# Patient Record
Sex: Male | Born: 1975
Health system: Southern US, Community
[De-identification: ages and names within clinical notes are randomized; demographics above are authoritative.]

## PROBLEM LIST (undated history)

## (undated) DIAGNOSIS — T7840XA Allergy, unspecified, initial encounter: Secondary | ICD-10-CM

## (undated) DIAGNOSIS — E669 Obesity, unspecified: Secondary | ICD-10-CM

## (undated) DIAGNOSIS — B019 Varicella without complication: Secondary | ICD-10-CM

## (undated) HISTORY — DX: Allergy, unspecified, initial encounter: T78.40XA

## (undated) HISTORY — DX: Varicella without complication: B01.9

## (undated) HISTORY — DX: Obesity, unspecified: E66.9

---

## 2015-04-12 ENCOUNTER — Encounter: Payer: Self-pay | Admitting: Adult Health

## 2015-12-04 DIAGNOSIS — Z23 Encounter for immunization: Secondary | ICD-10-CM | POA: Diagnosis not present

## 2016-01-30 DIAGNOSIS — H66002 Acute suppurative otitis media without spontaneous rupture of ear drum, left ear: Secondary | ICD-10-CM | POA: Diagnosis not present

## 2016-01-30 DIAGNOSIS — H6501 Acute serous otitis media, right ear: Secondary | ICD-10-CM | POA: Diagnosis not present

## 2016-01-30 DIAGNOSIS — B9789 Other viral agents as the cause of diseases classified elsewhere: Secondary | ICD-10-CM | POA: Diagnosis not present

## 2016-01-30 DIAGNOSIS — J069 Acute upper respiratory infection, unspecified: Secondary | ICD-10-CM | POA: Diagnosis not present

## 2016-02-04 ENCOUNTER — Ambulatory Visit: Payer: Self-pay | Admitting: Adult Health

## 2016-03-12 ENCOUNTER — Encounter: Payer: Self-pay | Admitting: Adult Health

## 2016-03-12 ENCOUNTER — Ambulatory Visit (INDEPENDENT_AMBULATORY_CARE_PROVIDER_SITE_OTHER): Payer: BLUE CROSS/BLUE SHIELD | Admitting: Adult Health

## 2016-03-12 VITALS — BP 138/72 | Temp 98.6°F | Ht 68.0 in | Wt 269.2 lb

## 2016-03-12 DIAGNOSIS — Z0000 Encounter for general adult medical examination without abnormal findings: Secondary | ICD-10-CM | POA: Diagnosis not present

## 2016-03-12 LAB — CBC WITH DIFFERENTIAL/PLATELET
BASOS PCT: 0.4 % (ref 0.0–3.0)
Basophils Absolute: 0 10*3/uL (ref 0.0–0.1)
Eosinophils Absolute: 0.1 10*3/uL (ref 0.0–0.7)
Eosinophils Relative: 2 % (ref 0.0–5.0)
HEMATOCRIT: 43 % (ref 39.0–52.0)
Hemoglobin: 14.4 g/dL (ref 13.0–17.0)
LYMPHS ABS: 1.7 10*3/uL (ref 0.7–4.0)
LYMPHS PCT: 25 % (ref 12.0–46.0)
MCHC: 33.5 g/dL (ref 30.0–36.0)
MCV: 92.1 fl (ref 78.0–100.0)
MONOS PCT: 7.1 % (ref 3.0–12.0)
Monocytes Absolute: 0.5 10*3/uL (ref 0.1–1.0)
NEUTROS ABS: 4.5 10*3/uL (ref 1.4–7.7)
NEUTROS PCT: 65.5 % (ref 43.0–77.0)
PLATELETS: 225 10*3/uL (ref 150.0–400.0)
RBC: 4.67 Mil/uL (ref 4.22–5.81)
RDW: 14.1 % (ref 11.5–15.5)
WBC: 6.9 10*3/uL (ref 4.0–10.5)

## 2016-03-12 LAB — HEPATIC FUNCTION PANEL
ALBUMIN: 4.3 g/dL (ref 3.5–5.2)
ALT: 35 U/L (ref 0–53)
AST: 23 U/L (ref 0–37)
Alkaline Phosphatase: 72 U/L (ref 39–117)
Bilirubin, Direct: 0.1 mg/dL (ref 0.0–0.3)
TOTAL PROTEIN: 6.9 g/dL (ref 6.0–8.3)
Total Bilirubin: 0.4 mg/dL (ref 0.2–1.2)

## 2016-03-12 LAB — POC URINALSYSI DIPSTICK (AUTOMATED)
Bilirubin, UA: NEGATIVE
Glucose, UA: NEGATIVE
KETONES UA: NEGATIVE
LEUKOCYTES UA: NEGATIVE
NITRITE UA: NEGATIVE
PROTEIN UA: NEGATIVE
RBC UA: NEGATIVE
Urobilinogen, UA: 0.2
pH, UA: 6

## 2016-03-12 LAB — LIPID PANEL
CHOLESTEROL: 199 mg/dL (ref 0–200)
HDL: 47.5 mg/dL (ref >39.00)
LDL Cholesterol: 136 mg/dL — ABNORMAL HIGH (ref 0–99)
NonHDL: 151.83
TRIGLYCERIDES: 77 mg/dL (ref 0.0–149.0)
Total CHOL/HDL Ratio: 4
VLDL: 15.4 mg/dL (ref 0.0–40.0)

## 2016-03-12 LAB — BASIC METABOLIC PANEL
BUN: 13 mg/dL (ref 6–23)
CALCIUM: 9.5 mg/dL (ref 8.4–10.5)
CHLORIDE: 105 meq/L (ref 96–112)
CO2: 29 meq/L (ref 19–32)
CREATININE: 0.85 mg/dL (ref 0.40–1.50)
GFR: 105.74 mL/min (ref >60.00)
GLUCOSE: 93 mg/dL (ref 70–99)
Potassium: 4.3 mEq/L (ref 3.5–5.1)
SODIUM: 140 meq/L (ref 135–145)

## 2016-03-12 LAB — TSH: TSH: 1.9 u[IU]/mL (ref 0.35–4.50)

## 2016-03-12 LAB — PSA: PSA: 1.52 ng/mL (ref 0.10–4.00)

## 2016-03-12 NOTE — Progress Notes (Signed)
Patient presents to clinic today to establish care. He is a pleasant 41 year old male who  has a past medical history of Chicken pox.   Acute Concerns: Complete Physical   Chronic Issues: Obesity  - He is the heaviest he has weighed. He understands that he needs to start exercising and working on diet  Health Maintenance: Dental -- Does not do routine care Vision -- Does not do routine care Immunizations -- UTD  Colonoscopy -- Never had  Diet: He eats a lot of veggies and fruits. He likes smoothies. Does eat fast food.  Exercise: Does not do routine exercise. He likes to ride his bike   Past Medical History:  Diagnosis Date  . Chicken pox     No past surgical history on file.  No current outpatient prescriptions on file prior to visit.   No current facility-administered medications on file prior to visit.     Not on File  Family History  Problem Relation Age of Onset  . Stroke Father   . Diabetes Father   . Heart attack Maternal Grandfather     Social History   Social History  . Marital status: Married    Spouse name: N/A  . Number of children: N/A  . Years of education: N/A   Occupational History  . Not on file.   Social History Main Topics  . Smoking status: Never Smoker  . Smokeless tobacco: Never Used  . Alcohol use 1.2 oz/week    1 Glasses of wine, 1 Cans of beer per week     Comment: 'social"  . Drug use: No  . Sexual activity: Not on file   Other Topics Concern  . Not on file   Social History Narrative  . No narrative on file    Review of Systems  Constitutional: Negative.   HENT: Negative.   Eyes: Negative.   Respiratory: Negative.   Cardiovascular: Negative.   Gastrointestinal: Negative.   Genitourinary: Negative.   Musculoskeletal: Negative.   Skin: Negative.   Neurological: Negative.   Endo/Heme/Allergies: Negative.   Psychiatric/Behavioral: Negative.   All other systems reviewed and are negative.   BP 138/72   Temp  98.6 F (37 C) (Oral)   Ht 5\' 8"  (1.727 m)   Wt 269 lb 3.2 oz (122.1 kg)   BMI 40.93 kg/m   Physical Exam  Constitutional: He is oriented to person, place, and time and well-developed, well-nourished, and in no distress. No distress.  obese  HENT:  Head: Normocephalic and atraumatic.  Right Ear: External ear normal.  Left Ear: External ear normal.  Nose: Nose normal.  Mouth/Throat: Oropharynx is clear and moist. No oropharyngeal exudate.  Eyes: Conjunctivae and EOM are normal. Pupils are equal, round, and reactive to light. Right eye exhibits no discharge. Left eye exhibits no discharge. No scleral icterus.  Neck: Normal range of motion. Neck supple. No JVD present. No tracheal deviation present. No thyromegaly present.  Cardiovascular: Normal rate, regular rhythm, normal heart sounds and intact distal pulses.  Exam reveals no gallop and no friction rub.   No murmur heard. Pulmonary/Chest: Effort normal and breath sounds normal. No stridor. No respiratory distress. He has no wheezes. He has no rales. He exhibits no tenderness.  Abdominal: Soft. Bowel sounds are normal. He exhibits no distension and no mass. There is no tenderness. There is no rebound and no guarding.  Musculoskeletal: Normal range of motion. He exhibits no edema, tenderness or deformity.  Lymphadenopathy:  He has no cervical adenopathy.  Neurological: He is alert and oriented to person, place, and time. He displays normal reflexes. No cranial nerve deficit. He exhibits normal muscle tone. Gait normal. Coordination normal. GCS score is 15.  Skin: Skin is warm and dry. No rash noted. He is not diaphoretic. No erythema. No pallor.  Psychiatric: Mood, memory, affect and judgment normal.  Nursing note and vitals reviewed.   Assessment/Plan: 1. Routine general medical examination at a health care facility - Basic metabolic panel - CBC with Differential/Platelet - Hepatic function panel - Lipid panel - POCT Urinalysis  Dipstick (Automated) - TSH - PSA - needs to start exercising and cut out fast food - Follow up in one month or sooner if needed  2. Severe obesity (BMI >= 40) (HCC) - Basic metabolic panel - CBC with Differential/Platelet - Hepatic function panel - Lipid panel - POCT Urinalysis Dipstick (Automated) - TSH - PSA - Educated on the importance of frequent exercise and heart healthy diet.  - Consider referral to weight loss center  Shirline Freesory Wadsworth Skolnick, NP

## 2016-04-21 ENCOUNTER — Telehealth: Payer: Self-pay | Admitting: Adult Health

## 2016-04-21 NOTE — Telephone Encounter (Signed)
° ° °  Jorge Walters pt returned your call saying he recevied his flu shot back in Oct 2017 at [facility]

## 2016-04-22 NOTE — Telephone Encounter (Signed)
Chart updated

## 2016-12-21 DIAGNOSIS — Z23 Encounter for immunization: Secondary | ICD-10-CM | POA: Diagnosis not present

## 2017-03-02 ENCOUNTER — Ambulatory Visit (INDEPENDENT_AMBULATORY_CARE_PROVIDER_SITE_OTHER): Payer: BLUE CROSS/BLUE SHIELD | Admitting: Adult Health

## 2017-03-02 ENCOUNTER — Encounter: Payer: Self-pay | Admitting: Adult Health

## 2017-03-02 VITALS — BP 126/88 | Temp 97.9°F | Ht 69.25 in | Wt 275.0 lb

## 2017-03-02 DIAGNOSIS — E668 Other obesity: Secondary | ICD-10-CM | POA: Diagnosis not present

## 2017-03-02 DIAGNOSIS — Z125 Encounter for screening for malignant neoplasm of prostate: Secondary | ICD-10-CM

## 2017-03-02 DIAGNOSIS — Z23 Encounter for immunization: Secondary | ICD-10-CM

## 2017-03-02 DIAGNOSIS — Z0000 Encounter for general adult medical examination without abnormal findings: Secondary | ICD-10-CM | POA: Diagnosis not present

## 2017-03-02 LAB — CBC WITH DIFFERENTIAL/PLATELET
BASOS PCT: 0.7 % (ref 0.0–3.0)
Basophils Absolute: 0 10*3/uL (ref 0.0–0.1)
EOS PCT: 1.7 % (ref 0.0–5.0)
Eosinophils Absolute: 0.1 10*3/uL (ref 0.0–0.7)
HCT: 44.3 % (ref 39.0–52.0)
Hemoglobin: 14.6 g/dL (ref 13.0–17.0)
Lymphocytes Relative: 23.7 % (ref 12.0–46.0)
Lymphs Abs: 1.7 10*3/uL (ref 0.7–4.0)
MCHC: 32.9 g/dL (ref 30.0–36.0)
MCV: 95.4 fl (ref 78.0–100.0)
MONO ABS: 0.7 10*3/uL (ref 0.1–1.0)
Monocytes Relative: 9 % (ref 3.0–12.0)
Neutro Abs: 4.7 10*3/uL (ref 1.4–7.7)
Neutrophils Relative %: 64.9 % (ref 43.0–77.0)
PLATELETS: 224 10*3/uL (ref 150.0–400.0)
RBC: 4.64 Mil/uL (ref 4.22–5.81)
RDW: 13.7 % (ref 11.5–15.5)
WBC: 7.3 10*3/uL (ref 4.0–10.5)

## 2017-03-02 LAB — HEPATIC FUNCTION PANEL
ALBUMIN: 4.4 g/dL (ref 3.5–5.2)
ALT: 25 U/L (ref 0–53)
AST: 20 U/L (ref 0–37)
Alkaline Phosphatase: 77 U/L (ref 39–117)
Bilirubin, Direct: 0.1 mg/dL (ref 0.0–0.3)
TOTAL PROTEIN: 6.8 g/dL (ref 6.0–8.3)
Total Bilirubin: 0.4 mg/dL (ref 0.2–1.2)

## 2017-03-02 LAB — LIPID PANEL
CHOLESTEROL: 198 mg/dL (ref 0–200)
HDL: 44.7 mg/dL (ref >39.00)
LDL Cholesterol: 133 mg/dL — ABNORMAL HIGH (ref 0–99)
NonHDL: 153.15
Total CHOL/HDL Ratio: 4
Triglycerides: 102 mg/dL (ref 0.0–149.0)
VLDL: 20.4 mg/dL (ref 0.0–40.0)

## 2017-03-02 LAB — BASIC METABOLIC PANEL
BUN: 12 mg/dL (ref 6–23)
CO2: 29 mEq/L (ref 19–32)
Calcium: 9.5 mg/dL (ref 8.4–10.5)
Chloride: 102 mEq/L (ref 96–112)
Creatinine, Ser: 0.94 mg/dL (ref 0.40–1.50)
GFR: 93.7 mL/min (ref >60.00)
GLUCOSE: 98 mg/dL (ref 70–99)
POTASSIUM: 4.2 meq/L (ref 3.5–5.1)
Sodium: 138 mEq/L (ref 135–145)

## 2017-03-02 LAB — TSH: TSH: 3.22 u[IU]/mL (ref 0.35–4.50)

## 2017-03-02 LAB — HEMOGLOBIN A1C: HEMOGLOBIN A1C: 5.7 % (ref 4.6–6.5)

## 2017-03-02 LAB — PSA: PSA: 1 ng/mL (ref 0.10–4.00)

## 2017-03-02 NOTE — Progress Notes (Signed)
Subjective:    Patient ID: Jorge Walters, male    DOB: 1975-12-10, 42 y.o.   MRN: [id number]  HPI  Patient presents for yearly preventative medicine examination. He is a pleasant 42 year old male who  has a past medical history of Chicken pox and Obesity.  All immunizations and health maintenance protocols were reviewed with the patient and needed orders were placed.  Appropriate screening laboratory values were ordered for the patient including screening of hyperlipidemia, renal function and hepatic function.  Medication reconciliation,  past medical history, social history, problem list and allergies were reviewed in detail with the patient  Goals were established with regard to weight loss, exercise, and  diet in compliance with medications. He does not follow a specific diet, has been eating a lot of fast food. He does ride his bike about 15 miles 3 x a week. Weight is up 6 pounds over the last year   Wt Readings from Last 3 Encounters:  03/02/17 275 lb (124.7 kg)  03/12/16 269 lb 3.2 oz (122.1 kg)   He does not participate in routine dental or vision exams. He plans on making an appointment with his dentist.   He has no acute complaints.   Review of Systems  Constitutional: Negative.   HENT: Negative.   Eyes: Negative.   Respiratory: Negative.   Cardiovascular: Negative.   Gastrointestinal: Negative.   Endocrine: Negative.   Genitourinary: Negative.   Musculoskeletal: Negative.   Skin: Negative.   Allergic/Immunologic: Negative.   Neurological: Negative.   Hematological: Negative.   Psychiatric/Behavioral: Negative.   All other systems reviewed and are negative.  Past Medical History:  Diagnosis Date  . Chicken pox   . Obesity     Social History   Socioeconomic History  . Marital status: Married    Spouse name: Not on file  . Number of children: Not on file  . Years of education: Not on file  . Highest education level: Not on file  Social Needs    . Financial resource strain: Not on file  . Food insecurity - worry: Not on file  . Food insecurity - inability: Not on file  . Transportation needs - medical: Not on file  . Transportation needs - non-medical: Not on file  Occupational History  . Not on file  Tobacco Use  . Smoking status: Never Smoker  . Smokeless tobacco: Never Used  Substance and Sexual Activity  . Alcohol use: Yes    Alcohol/week: 1.2 oz    Types: 1 Glasses of wine, 1 Cans of beer per week    Comment: 'social"  . Drug use: No  . Sexual activity: Not on file  Other Topics Concern  . 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    Married    No children        History reviewed. No pertinent surgical history.  Family History  Problem Relation Age of Onset  . Stroke Father   . Diabetes Father   . Heart attack Maternal Grandfather     No Known Allergies  Current Outpatient Medications on File Prior to Visit  Medication Sig Dispense Refill  . cetirizine (KLS ALLER-TEC) 10 MG tablet Take 10 mg by mouth daily.    Marland Kitchen GLUCOSAMINE-CHONDROITIN PO Take by mouth.    . Multiple Vitamin (MULTIVITAMIN) capsule Take 1 capsule by mouth daily.    . Omega-3 Fatty Acids (FISH OIL) 1000  MG CAPS Take by mouth.    . Probiotic Product (PROBIOTIC-10) CAPS Take by mouth.     No current facility-administered medications on file prior to visit.     BP 126/88 (BP Location: Left Arm)   Temp 97.9 F (36.6 C) (Oral)   Ht 5' 9.25" (1.759 m)   Wt 275 lb (124.7 kg)   BMI 40.32 kg/m       Objective:   Physical Exam  Constitutional: He is oriented to person, place, and time. He appears well-developed and well-nourished. No distress.  Obese    HENT:  Head: Normocephalic and atraumatic.  Right Ear: External ear normal.  Left Ear: External ear normal.  Nose: Nose normal.  Mouth/Throat: Oropharynx is clear and moist. No oropharyngeal exudate.  Eyes: Conjunctivae  and EOM are normal. Pupils are equal, round, and reactive to light. Right eye exhibits no discharge. Left eye exhibits no discharge. No scleral icterus.  Neck: Normal range of motion. Neck supple. No JVD present. No tracheal deviation present. No thyromegaly present.  Cardiovascular: Normal rate, regular rhythm, normal heart sounds and intact distal pulses. Exam reveals no gallop and no friction rub.  No murmur heard. Pulmonary/Chest: Effort normal and breath sounds normal. No stridor. No respiratory distress. He has no wheezes. He has no rales. He exhibits no tenderness.  Abdominal: Soft. Bowel sounds are normal. He exhibits no distension and no mass. There is no tenderness. There is no rebound and no guarding.  Musculoskeletal: Normal range of motion. He exhibits no edema, tenderness or deformity.  Lymphadenopathy:    He has no cervical adenopathy.  Neurological: He is alert and oriented to person, place, and time. He has normal reflexes. He displays normal reflexes. No cranial nerve deficit. He exhibits normal muscle tone. Coordination normal.  Skin: Skin is warm and dry. No rash noted. He is not diaphoretic. No erythema. No pallor.  Psychiatric: He has a normal mood and affect. His behavior is normal. Judgment and thought content normal.  Nursing note and vitals reviewed.     Assessment & Plan:  1. Routine general medical examination at a health care facility - Needs to lose weight  - Follow up in one year or sooner if needed - Basic metabolic panel - CBC with Differential/Platelet - Hemoglobin A1c - Hepatic function panel - Lipid panel - TSH - PSA  2. Other obesity - Needs to cut out fast food.  - Would like to work on diet for 6 months and then if needed be referred to weight loss clinic  - Basic metabolic panel - CBC with Differential/Platelet - Hemoglobin A1c - Hepatic function panel - Lipid panel - TSH - PSA   3. Need for Tdap vaccination - Tdap vaccine greater than  or equal to 7yo IM   Shirline Freesory Traye Bates, NP

## 2017-05-22 DIAGNOSIS — J0140 Acute pansinusitis, unspecified: Secondary | ICD-10-CM | POA: Diagnosis not present

## 2017-12-17 ENCOUNTER — Other Ambulatory Visit: Payer: Self-pay

## 2017-12-17 ENCOUNTER — Emergency Department (HOSPITAL_COMMUNITY)
Admission: EM | Admit: 2017-12-17 | Discharge: 2017-12-17 | Disposition: A | Discharge status: Home / Self Care | Payer: BLUE CROSS/BLUE SHIELD | Attending: Emergency Medicine | Admitting: Emergency Medicine

## 2017-12-17 ENCOUNTER — Encounter (HOSPITAL_COMMUNITY): Payer: Self-pay | Admitting: Emergency Medicine

## 2017-12-17 ENCOUNTER — Emergency Department (HOSPITAL_COMMUNITY): Payer: BLUE CROSS/BLUE SHIELD

## 2017-12-17 DIAGNOSIS — R0789 Other chest pain: Secondary | ICD-10-CM

## 2017-12-17 DIAGNOSIS — M25512 Pain in left shoulder: Secondary | ICD-10-CM | POA: Insufficient documentation

## 2017-12-17 DIAGNOSIS — Z79899 Other long term (current) drug therapy: Secondary | ICD-10-CM | POA: Insufficient documentation

## 2017-12-17 DIAGNOSIS — R079 Chest pain, unspecified: Secondary | ICD-10-CM | POA: Diagnosis not present

## 2017-12-17 DIAGNOSIS — E785 Hyperlipidemia, unspecified: Secondary | ICD-10-CM | POA: Insufficient documentation

## 2017-12-17 LAB — CBC
HCT: 42.3 % (ref 39.0–52.0)
Hemoglobin: 13.7 g/dL (ref 13.0–17.0)
MCH: 31.3 pg (ref 26.0–34.0)
MCHC: 32.4 g/dL (ref 30.0–36.0)
MCV: 96.6 fL (ref 80.0–100.0)
NRBC: 0 % (ref 0.0–0.2)
PLATELETS: 193 10*3/uL (ref 150–400)
RBC: 4.38 MIL/uL (ref 4.22–5.81)
RDW: 13.2 % (ref 11.5–15.5)
WBC: 6.8 10*3/uL (ref 4.0–10.5)

## 2017-12-17 LAB — BASIC METABOLIC PANEL
Anion gap: 9 (ref 5–15)
BUN: 11 mg/dL (ref 6–20)
CALCIUM: 9.3 mg/dL (ref 8.9–10.3)
CO2: 24 mmol/L (ref 22–32)
CREATININE: 0.85 mg/dL (ref 0.61–1.24)
Chloride: 104 mmol/L (ref 98–111)
Glucose, Bld: 102 mg/dL — ABNORMAL HIGH (ref 70–99)
Potassium: 3.9 mmol/L (ref 3.5–5.1)
SODIUM: 137 mmol/L (ref 135–145)

## 2017-12-17 LAB — I-STAT TROPONIN, ED
TROPONIN I, POC: 0 ng/mL (ref 0.00–0.08)
TROPONIN I, POC: 0 ng/mL (ref 0.00–0.08)

## 2017-12-17 LAB — D-DIMER, QUANTITATIVE (NOT AT ARMC)

## 2017-12-17 MED ORDER — KETOROLAC TROMETHAMINE 15 MG/ML IJ SOLN
15.0000 mg | Freq: Once | INTRAMUSCULAR | Status: AC
  Administered 2017-12-17: 15 mg via INTRAVENOUS
  Filled 2017-12-17: qty 1

## 2017-12-17 NOTE — ED Triage Notes (Signed)
Pt from home with c/o left sides CP that began a few days ago but got worse this morning. Pt states pain is now more centrally located. Pt denies sob and denies any recent changes in diet or heavy lifting.

## 2017-12-17 NOTE — ED Provider Notes (Signed)
[facility]-EMERGENCY DEPT Provider Note   CSN: [id number] Arrival date & time: 12/17/17  4098     History   Chief Complaint Chief Complaint  Patient presents with  . Chest Pain    HPI Jorge Walters is a 42 y.o. male.  HPI  42 year old male comes in with chief complaint of chest pain.  Patient is obese and has borderline hyperlipidemia.  He reports that he has had 2 or 3 episodes of subscapular left-sided pain over the past few days.  This morning he woke up with severe subscapular pain that was radiating towards his left chest.  Pain was different than his prior episodes which did not radiate all the way to the left chest.  Patient denies any associated shortness of breath, nausea, diaphoresis.  The pain is worse in certain positions and when he takes a deep breath.  He is unsure of any exertional component to the pain.  Patient denies any smoking.  He does indicate that he took a 7000 mile road trip in September.  There is no history of DVT, PE and patient denies any substance abuse.  Past Medical History:  Diagnosis Date  . Chicken pox   . Obesity     Patient Active Problem List   Diagnosis Date Noted  . Other obesity 03/02/2017    History reviewed. No pertinent surgical history.      Home Medications    Prior to Admission medications   Medication Sig Start Date End Date Taking? Authorizing Provider  cetirizine (KLS ALLER-TEC) 10 MG tablet Take 10 mg by mouth daily.   Yes [provider]  GLUCOSAMINE-CHONDROITIN PO Take by mouth.   Yes [provider]  Multiple Vitamin (MULTIVITAMIN) capsule Take 1 capsule by mouth daily.   Yes [provider]  Omega-3 Fatty Acids (FISH OIL) 1000 MG CAPS Take by mouth.   Yes [provider]  Probiotic Product (PROBIOTIC-10) CAPS Take by mouth.   Yes [provider]    Family History Family History  Problem Relation Age of Onset  . Stroke Father   .  Diabetes Father   . Heart attack Maternal Grandfather     Social History Social History   Tobacco Use  . Smoking status: Never Smoker  . Smokeless tobacco: Never Used  Substance Use Topics  . Alcohol use: Yes    Alcohol/week: 2.0 standard drinks    Types: 1 Glasses of wine, 1 Cans of beer per week    Comment: 'social"  . Drug use: No     Allergies   Patient has no known allergies.   Review of Systems Review of Systems  Constitutional: Positive for activity change.  Respiratory: Negative for shortness of breath.   Cardiovascular: Positive for chest pain.  Gastrointestinal: Negative for nausea.  All other systems reviewed and are negative.    Physical Exam Updated Vital Signs BP (!) 141/90   Pulse 65   Temp 97.9 F (36.6 C) (Oral)   Resp (!) 25   SpO2 96%   Physical Exam  Constitutional: He is oriented to person, place, and time. He appears well-developed.  HENT:  Head: Atraumatic.  Neck: Neck supple.  Cardiovascular: Normal rate, intact distal pulses and normal pulses.  Pulmonary/Chest: Effort normal.  Neurological: He is alert and oriented to person, place, and time.  Skin: Skin is warm.  Nursing note and vitals reviewed.    ED Treatments / Results  Labs (all labs ordered are listed, but only  abnormal results are displayed) Labs Reviewed  BASIC METABOLIC PANEL - Abnormal; Notable for the following components:      Result Value   Glucose, Bld 102 (*)    All other components within normal limits  CBC  D-DIMER, QUANTITATIVE (NOT AT [facility])  I-STAT TROPONIN, ED  I-STAT TROPONIN, ED    EKG EKG Interpretation  Date/Time:  Friday December 17 2017 07:07:23 EDT Ventricular Rate:  74 PR Interval:    QRS Duration: 93 QT Interval:  371 QTC Calculation: 412 R Axis:   60 Text Interpretation:  Sinus rhythm No acute changes No old tracing to compare Confirmed by Derwood Kaplan [phone]) on 12/17/2017 7:17:03 AM   Radiology Dg Chest 2 View  Result Date:  12/17/2017 CLINICAL DATA:  Chest pain EXAM: CHEST - 2 VIEW COMPARISON:  None. FINDINGS: Lungs are clear. Heart size and pulmonary vascularity are normal. No pneumothorax. No adenopathy. No bone lesions. IMPRESSION: No edema or consolidation. Electronically Signed   By: Bretta Bang III M.D.   On: 12/17/2017 08:22    Procedures Procedures (including critical care time)  Medications Ordered in ED Medications  ketorolac (TORADOL) 15 MG/ML injection 15 mg (15 mg Intravenous Given 12/17/17 0956)     Initial Impression / Assessment and Plan / ED Course  I have reviewed the triage vital signs and the nursing notes.  Pertinent labs & imaging results that were available during my care of the patient were reviewed by me and considered in my medical decision making (see chart for details).  Clinical Course as of Dec 17 1216  Caleen Essex Dec 17, 2017  6045 Results from the ER workup discussed with the patient face to face and all questions answered to the best of my ability.    [AN]    Clinical Course User Index [AN] Derwood Kaplan, MD    42 year old male comes in with chief complaint of chest pain.  Patient's pain is actually starting in the subscapular region on the left side and radiating up towards the chest.  Pain is also worse with deep inspiration and there are no associated concerning constitutional's.  HEAR score is 2. EKG has no acute findings.  Patient's Wells score is indicating that he has low probability for PE, therefore we will proceed with a d-dimer.  Final Clinical Impressions(s) / ED Diagnoses   Final diagnoses:  Atypical chest pain  Subscapular pain, left    ED Discharge Orders    None       Derwood Kaplan, MD 12/17/17 1218

## 2017-12-17 NOTE — Discharge Instructions (Addendum)

## 2017-12-21 ENCOUNTER — Telehealth: Payer: Self-pay | Admitting: *Deleted

## 2017-12-21 NOTE — Telephone Encounter (Signed)
FYI

## 2017-12-21 NOTE — Telephone Encounter (Signed)
Copied from CRM [id number]. Topic: General - Inquiry >> Dec 21, 2017  1:13 PM Crist Infante wrote: Reason for CRM: pt wants Kandee Keen to know he went to the [facility] on 11/01 for chest pain.  Pt states this pain was different than before.  But everything checked out. He states unless you want to see him, he will see you in Jan for his yearly. Pt did not want you to be surprised when he sees you next.

## 2018-08-07 DIAGNOSIS — Z1159 Encounter for screening for other viral diseases: Secondary | ICD-10-CM | POA: Diagnosis not present

## 2020-03-14 ENCOUNTER — Other Ambulatory Visit: Payer: Self-pay

## 2020-03-15 ENCOUNTER — Ambulatory Visit (INDEPENDENT_AMBULATORY_CARE_PROVIDER_SITE_OTHER): Payer: BC Managed Care – PPO | Admitting: Adult Health

## 2020-03-15 ENCOUNTER — Encounter: Payer: Self-pay | Admitting: Adult Health

## 2020-03-15 VITALS — BP 118/78 | Temp 97.7°F | Ht 69.25 in | Wt 281.0 lb

## 2020-03-15 DIAGNOSIS — Z0000 Encounter for general adult medical examination without abnormal findings: Secondary | ICD-10-CM | POA: Diagnosis not present

## 2020-03-15 DIAGNOSIS — E668 Other obesity: Secondary | ICD-10-CM | POA: Diagnosis not present

## 2020-03-15 LAB — CBC WITH DIFFERENTIAL/PLATELET
Basophils Absolute: 0 10*3/uL (ref 0.0–0.1)
Basophils Relative: 0.8 % (ref 0.0–3.0)
Eosinophils Absolute: 0.1 10*3/uL (ref 0.0–0.7)
Eosinophils Relative: 1.8 % (ref 0.0–5.0)
HCT: 42.3 % (ref 39.0–52.0)
Hemoglobin: 14.2 g/dL (ref 13.0–17.0)
Lymphocytes Relative: 27.7 % (ref 12.0–46.0)
Lymphs Abs: 1.5 10*3/uL (ref 0.7–4.0)
MCHC: 33.6 g/dL (ref 30.0–36.0)
MCV: 93 fl (ref 78.0–100.0)
Monocytes Absolute: 0.5 10*3/uL (ref 0.1–1.0)
Monocytes Relative: 9.9 % (ref 3.0–12.0)
Neutro Abs: 3.3 10*3/uL (ref 1.4–7.7)
Neutrophils Relative %: 59.8 % (ref 43.0–77.0)
Platelets: 210 10*3/uL (ref 150.0–400.0)
RBC: 4.54 Mil/uL (ref 4.22–5.81)
RDW: 13.7 % (ref 11.5–15.5)
WBC: 5.5 10*3/uL (ref 4.0–10.5)

## 2020-03-15 LAB — COMPREHENSIVE METABOLIC PANEL
ALT: 32 U/L (ref 0–53)
AST: 34 U/L (ref 0–37)
Albumin: 4.2 g/dL (ref 3.5–5.2)
Alkaline Phosphatase: 62 U/L (ref 39–117)
BUN: 10 mg/dL (ref 6–23)
CO2: 29 mEq/L (ref 19–32)
Calcium: 9.5 mg/dL (ref 8.4–10.5)
Chloride: 103 mEq/L (ref 96–112)
Creatinine, Ser: 0.96 mg/dL (ref 0.40–1.50)
GFR: 96.01 mL/min (ref >60.00)
Glucose, Bld: 94 mg/dL (ref 70–99)
Potassium: 4.4 mEq/L (ref 3.5–5.1)
Sodium: 138 mEq/L (ref 135–145)
Total Bilirubin: 0.5 mg/dL (ref 0.2–1.2)
Total Protein: 6.6 g/dL (ref 6.0–8.3)

## 2020-03-15 LAB — LIPID PANEL
Cholesterol: 180 mg/dL (ref 0–200)
HDL: 45.2 mg/dL (ref >39.00)
LDL Cholesterol: 110 mg/dL — ABNORMAL HIGH (ref 0–99)
NonHDL: 135.2
Total CHOL/HDL Ratio: 4
Triglycerides: 124 mg/dL (ref 0.0–149.0)
VLDL: 24.8 mg/dL (ref 0.0–40.0)

## 2020-03-15 LAB — HEMOGLOBIN A1C: Hgb A1c MFr Bld: 5.5 % (ref 4.6–6.5)

## 2020-03-15 LAB — TSH: TSH: 3.29 u[IU]/mL (ref 0.35–4.50)

## 2020-03-15 NOTE — Patient Instructions (Addendum)
It was great seeing you today   We will follow up with you regarding your blood work   Please continue to exercise   I will see you back in one year

## 2020-03-15 NOTE — Progress Notes (Signed)
Subjective:    Patient ID: Jorge Walters, male    DOB: [birth year], 45 y.o.   MRN: [id number]  HPI Patient presents for yearly preventative medicine examination. He is a pleasant 45 year old male who  has a past medical history of Chicken pox and Obesity.   He was last seen in the office in 2019.   Obesity - Since December 2019 he has been riding his bike for 30 minutes 4-5 times a week and has been eating healthier with fruits and vegetables.   All immunizations and health maintenance protocols were reviewed with the patient and needed orders were placed.  Appropriate screening laboratory values were ordered for the patient including screening of hyperlipidemia, renal function and hepatic function.  Medication reconciliation,  past medical history, social history, problem list and allergies were reviewed in detail with the patient  Goals were established with regard to weight loss, exercise, and  diet in compliance with medications.  Wt Readings from Last 3 Encounters:  03/15/20 281 lb (127.5 kg)  03/02/17 275 lb (124.7 kg)  03/12/16 269 lb 3.2 oz (122.1 kg)   Review of Systems  Constitutional: Negative.   HENT: Negative.   Eyes: Negative.   Respiratory: Negative.   Cardiovascular: Negative.   Gastrointestinal: Negative.   Endocrine: Negative.   Genitourinary: Negative.   Musculoskeletal: Negative.   Skin: Negative.   Allergic/Immunologic: Negative.   Neurological: Negative.   Hematological: Negative.   Psychiatric/Behavioral: Negative.   All other systems reviewed and are negative.  Past Medical History:  Diagnosis Date  . Chicken pox   . Obesity     Social History   Socioeconomic History  . Marital status: Married    Spouse name: Not on file  . Number of children: Not on file  . Years of education: Not on file  . Highest education level: Not on file  Occupational History  . Not on file  Tobacco Use  . Smoking status: Never Smoker  . Smokeless  tobacco: Never Used  Substance and Sexual Activity  . Alcohol use: Yes    Alcohol/week: 2.0 standard drinks    Types: 1 Glasses of wine, 1 Cans of beer per week    Comment: 'social"  . Drug use: No  . Sexual activity: Not on file  Other Topics Concern  . 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    Married    No children       Social Determinants of Corporate investment banker Strain: Not on file  Food Insecurity: Not on file  Transportation Needs: Not on file  Physical Activity: Not on file  Stress: Not on file  Social Connections: Not on file  Intimate Partner Violence: Not on file    History reviewed. No pertinent surgical history.  Family History  Problem Relation Age of Onset  . Stroke Father   . Diabetes Father   . Heart attack Maternal Grandfather     No Known Allergies  Current Outpatient Medications on File Prior to Visit  Medication Sig Dispense Refill  . cetirizine (ZYRTEC) 10 MG tablet Take 10 mg by mouth daily.    Marland Kitchen GLUCOSAMINE-CHONDROITIN PO Take by mouth.    . Multiple Vitamin (MULTIVITAMIN) capsule Take 1 capsule by mouth daily.    . Omega-3 Fatty Acids (FISH OIL) 1000 MG CAPS Take by mouth.    . Probiotic Product (PROBIOTIC-10) CAPS Take by mouth.  No current facility-administered medications on file prior to visit.    BP (!) 130/92   Temp 97.7 F (36.5 C)   Ht 5' 9.25" (1.759 m) Comment: WITH SHOES  Wt 281 lb (127.5 kg)   BMI 41.20 kg/m       Objective:   Physical Exam Vitals and nursing note reviewed.  Constitutional:      General: He is not in acute distress.    Appearance: Normal appearance. He is well-developed. He is obese.  HENT:     Head: Normocephalic and atraumatic.     Right Ear: Tympanic membrane, ear canal and external ear normal. There is no impacted cerumen.     Left Ear: Tympanic membrane, ear canal and external ear normal. There is no impacted cerumen.      Nose: Nose normal. No congestion or rhinorrhea.     Mouth/Throat:     Mouth: Mucous membranes are moist.     Pharynx: Oropharynx is clear. No oropharyngeal exudate or posterior oropharyngeal erythema.  Eyes:     General:        Right eye: No discharge.        Left eye: No discharge.     Extraocular Movements: Extraocular movements intact.     Conjunctiva/sclera: Conjunctivae normal.     Pupils: Pupils are equal, round, and reactive to light.  Neck:     Vascular: No carotid bruit.     Trachea: No tracheal deviation.  Cardiovascular:     Rate and Rhythm: Normal rate and regular rhythm.     Pulses: Normal pulses.     Heart sounds: Normal heart sounds. No murmur heard. No friction rub. No gallop.   Pulmonary:     Effort: Pulmonary effort is normal. No respiratory distress.     Breath sounds: Normal breath sounds. No stridor. No wheezing, rhonchi or rales.  Chest:     Chest wall: No tenderness.  Abdominal:     General: Bowel sounds are normal. There is no distension.     Palpations: Abdomen is soft. There is no mass.     Tenderness: There is no abdominal tenderness. There is no right CVA tenderness, left CVA tenderness, guarding or rebound.     Hernia: No hernia is present.  Musculoskeletal:        General: No swelling, tenderness, deformity or signs of injury. Normal range of motion.     Right lower leg: No edema.     Left lower leg: No edema.  Lymphadenopathy:     Cervical: No cervical adenopathy.  Skin:    General: Skin is warm and dry.     Capillary Refill: Capillary refill takes less than 2 seconds.     Coloration: Skin is not jaundiced or pale.     Findings: No bruising, erythema, lesion or rash.  Neurological:     General: No focal deficit present.     Mental Status: He is alert and oriented to person, place, and time.     Cranial Nerves: No cranial nerve deficit.     Sensory: No sensory deficit.     Motor: No weakness.     Coordination: Coordination normal.      Gait: Gait normal.     Deep Tendon Reflexes: Reflexes normal.  Psychiatric:        Mood and Affect: Mood normal.        Behavior: Behavior normal.        Thought Content: Thought content normal.  Judgment: Judgment normal.       Assessment & Plan:  1. Routine general medical examination at a health care facility - Follow up in one year or sooner if needed - CBC with Differential/Platelet; Future - Comprehensive metabolic panel; Future - Hemoglobin A1c; Future - Lipid panel; Future - TSH; Future  2. Other obesity - Increase resistance and duration of time - Continue to eat healthy - CBC with Differential/Platelet; Future - Comprehensive metabolic panel; Future - Hemoglobin A1c; Future - Lipid panel; Future - TSH; Future  Shirline Frees, NP

## 2020-06-10 ENCOUNTER — Other Ambulatory Visit: Payer: Self-pay

## 2020-06-11 ENCOUNTER — Encounter: Payer: Self-pay | Admitting: Adult Health

## 2020-06-11 ENCOUNTER — Ambulatory Visit (INDEPENDENT_AMBULATORY_CARE_PROVIDER_SITE_OTHER): Payer: BC Managed Care – PPO | Admitting: Adult Health

## 2020-06-11 VITALS — BP 136/84 | HR 81 | Temp 98.0°F | Wt 276.8 lb

## 2020-06-11 DIAGNOSIS — N50819 Testicular pain, unspecified: Secondary | ICD-10-CM

## 2020-06-11 DIAGNOSIS — Z1211 Encounter for screening for malignant neoplasm of colon: Secondary | ICD-10-CM | POA: Diagnosis not present

## 2020-06-11 NOTE — Progress Notes (Signed)
Subjective:    Patient ID: Jorge Walters, male    DOB: 06-Sep-1975, 45 y.o.   MRN: [id number]  HPI 45 year old male who  has a past medical history of Chicken pox and Obesity.  He presents to the office today for an acute issue of testicular pain   Symptoms have been present since around 04/05/2020. He reports that he had some discomfort 4/10 after exercise ( bike riding and hiking), he did have some inner thigh pain with this as wel. After about 4 days of rest his symptoms resolved. Since that time his symptoms have been intermittent but over the last two weeks been more constant with 1-2/10 pain level. During this time he continues to ride his Pelaton.  Over the last few days he has decreased his Peloton use and his symptoms are slowly starting to resolve again.  He denies any testicular or scrotal swelling, testicular lumps or bumps, symptoms of UTI, fevers, or chills.  He has been having normal bowel movements with no blood or pain.  No abdominal pain.  He has not been using any medications over-the-counter  He is turning 45 and would like to schedule his colonoscopy   Review of Systems See HPI   Past Medical History:  Diagnosis Date  . Chicken pox   . Obesity     Social History   Socioeconomic History  . Marital status: Married    Spouse name: Not on file  . Number of children: Not on file  . Years of education: Not on file  . Highest education level: Not on file  Occupational History  . Not on file  Tobacco Use  . Smoking status: Never Smoker  . Smokeless tobacco: Never Used  Substance and Sexual Activity  . Alcohol use: Yes    Alcohol/week: 2.0 standard drinks    Types: 1 Glasses of wine, 1 Cans of beer per week    Comment: 'social"  . Drug use: No  . Sexual activity: Not on file  Other Topics Concern  . 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    Married    No children       Social  Determinants of Corporate investment banker Strain: Not on file  Food Insecurity: Not on file  Transportation Needs: Not on file  Physical Activity: Not on file  Stress: Not on file  Social Connections: Not on file  Intimate Partner Violence: Not on file    No past surgical history on file.  Family History  Problem Relation Age of Onset  . Stroke Father   . Diabetes Father   . Heart attack Maternal Grandfather     No Known Allergies  Current Outpatient Medications on File Prior to Visit  Medication Sig Dispense Refill  . cetirizine (ZYRTEC) 10 MG tablet Take 10 mg by mouth daily.    Marland Kitchen GLUCOSAMINE-CHONDROITIN PO Take by mouth.    . Multiple Vitamin (MULTIVITAMIN) capsule Take 1 capsule by mouth daily.    . Omega-3 Fatty Acids (FISH OIL) 1000 MG CAPS Take by mouth.    . Probiotic Product (PROBIOTIC-10) CAPS Take by mouth.     No current facility-administered medications on file prior to visit.    BP 136/84 (BP Location: Left Arm, Patient Position: Sitting, Cuff Size: Normal)   Pulse 81   Temp 98 F (36.7 C) (Oral)   Wt 276 lb 12.8 oz (125.6 kg)  SpO2 91%   BMI 40.58 kg/m       Objective:   Physical Exam Vitals and nursing note reviewed.  Constitutional:      Appearance: Normal appearance.  Abdominal:     Hernia: There is no hernia in the left inguinal area or right inguinal area.  Genitourinary:    Penis: Normal. No erythema, tenderness, discharge, swelling or lesions.      Testes: Normal. Cremasteric reflex is present.        Right: Mass, tenderness, swelling, testicular hydrocele or varicocele not present.        Left: Mass, tenderness, swelling, testicular hydrocele or varicocele not present.     Epididymis:     Right: Normal.     Left: Normal.  Musculoskeletal:        General: Normal range of motion.  Lymphadenopathy:     Lower Body: No right inguinal adenopathy. No left inguinal adenopathy.  Skin:    General: Skin is warm and dry.     Capillary  Refill: Capillary refill takes less than 2 seconds.  Neurological:     Mental Status: He is alert.       Assessment & Plan:  1. Testicular discomfort - Completely benign exam -He is going to go to [place] for a conference in the next few days, will have him not ride his bike until he returns.  If symptoms have resolved and come back with bicycle riding then he may need to have a new or get some padded shorts.  If symptoms have continued and he let me know and we will order an ultrasound  2. Colon cancer screening  - Ambulatory referral to Gastroenterology  Shirline Frees, NP

## 2020-06-16 IMAGING — CR DG CHEST 2V
2 series · 2 of 2 positions shown · non-contrast
Comparison: None.

CLINICAL DATA: Chest pain

EXAM:
CHEST - 2 VIEW

[w chest pa]
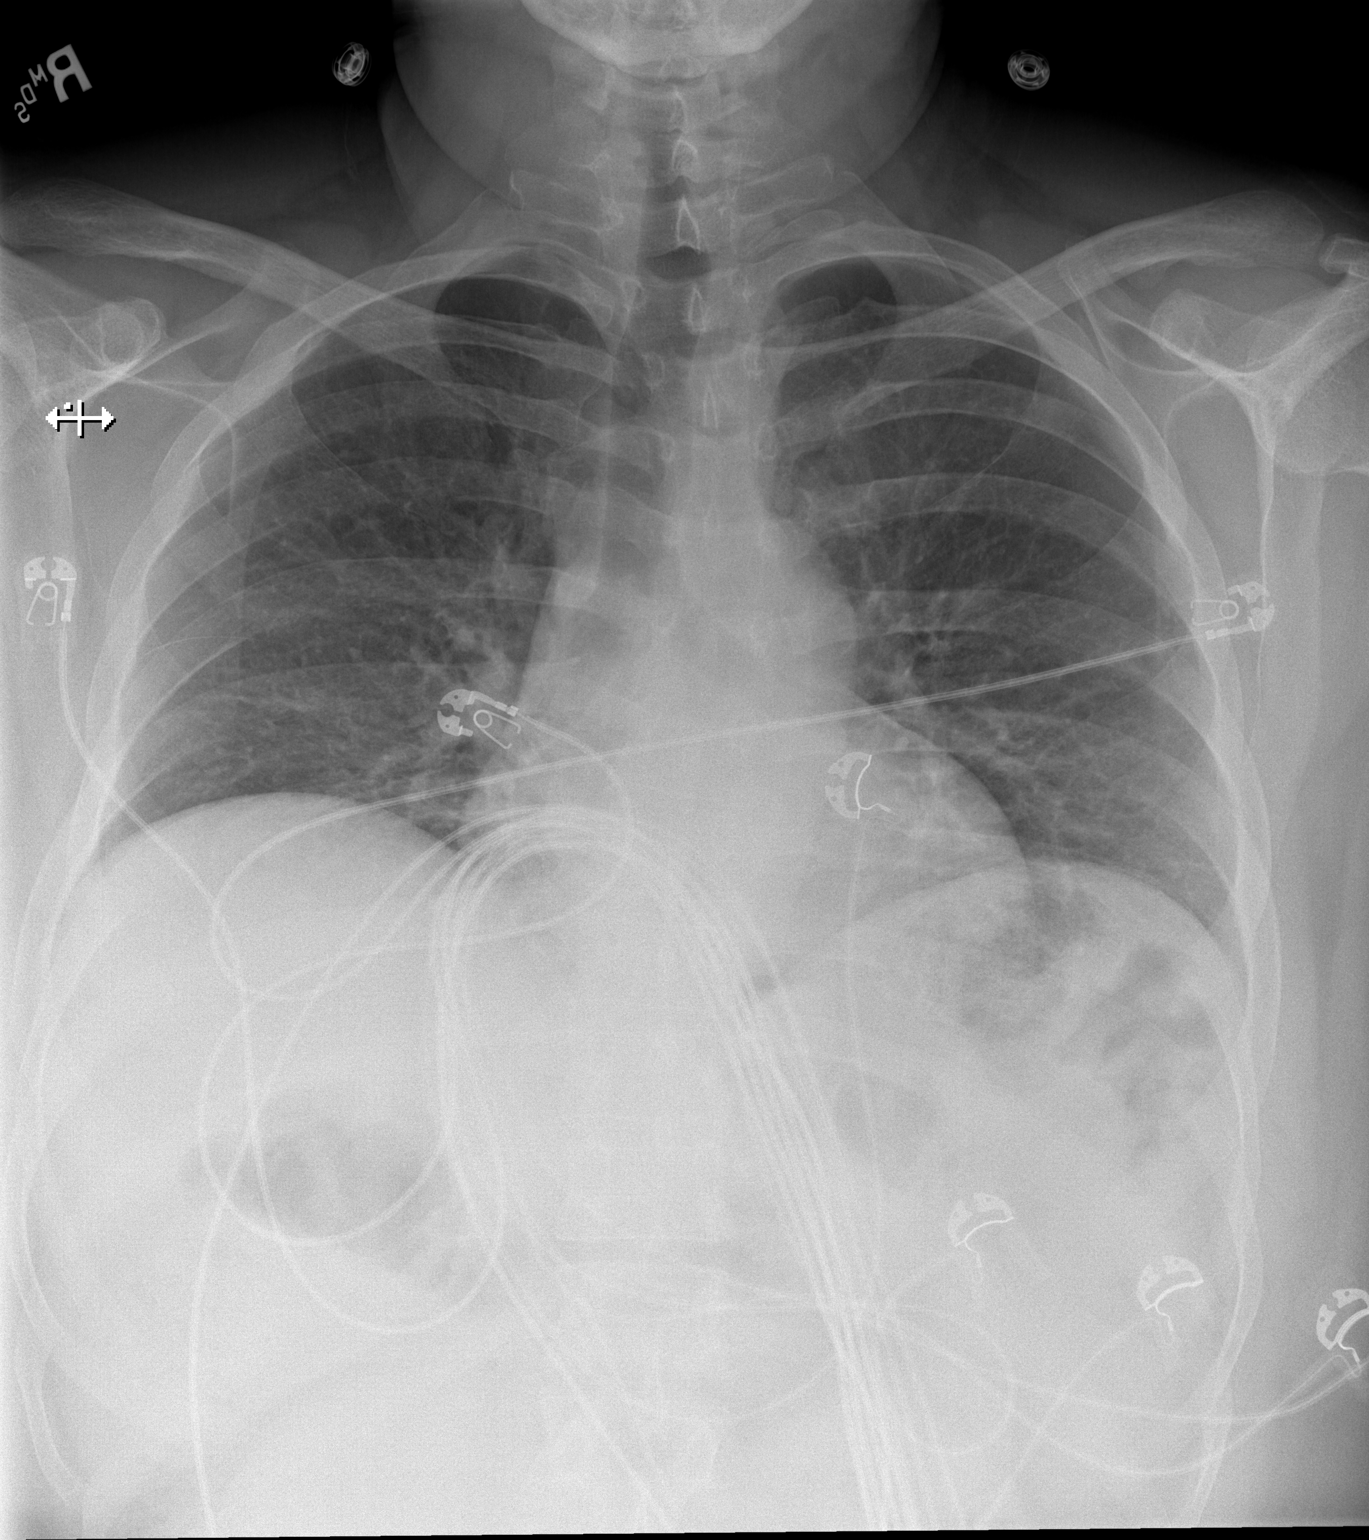

[w chest lat]
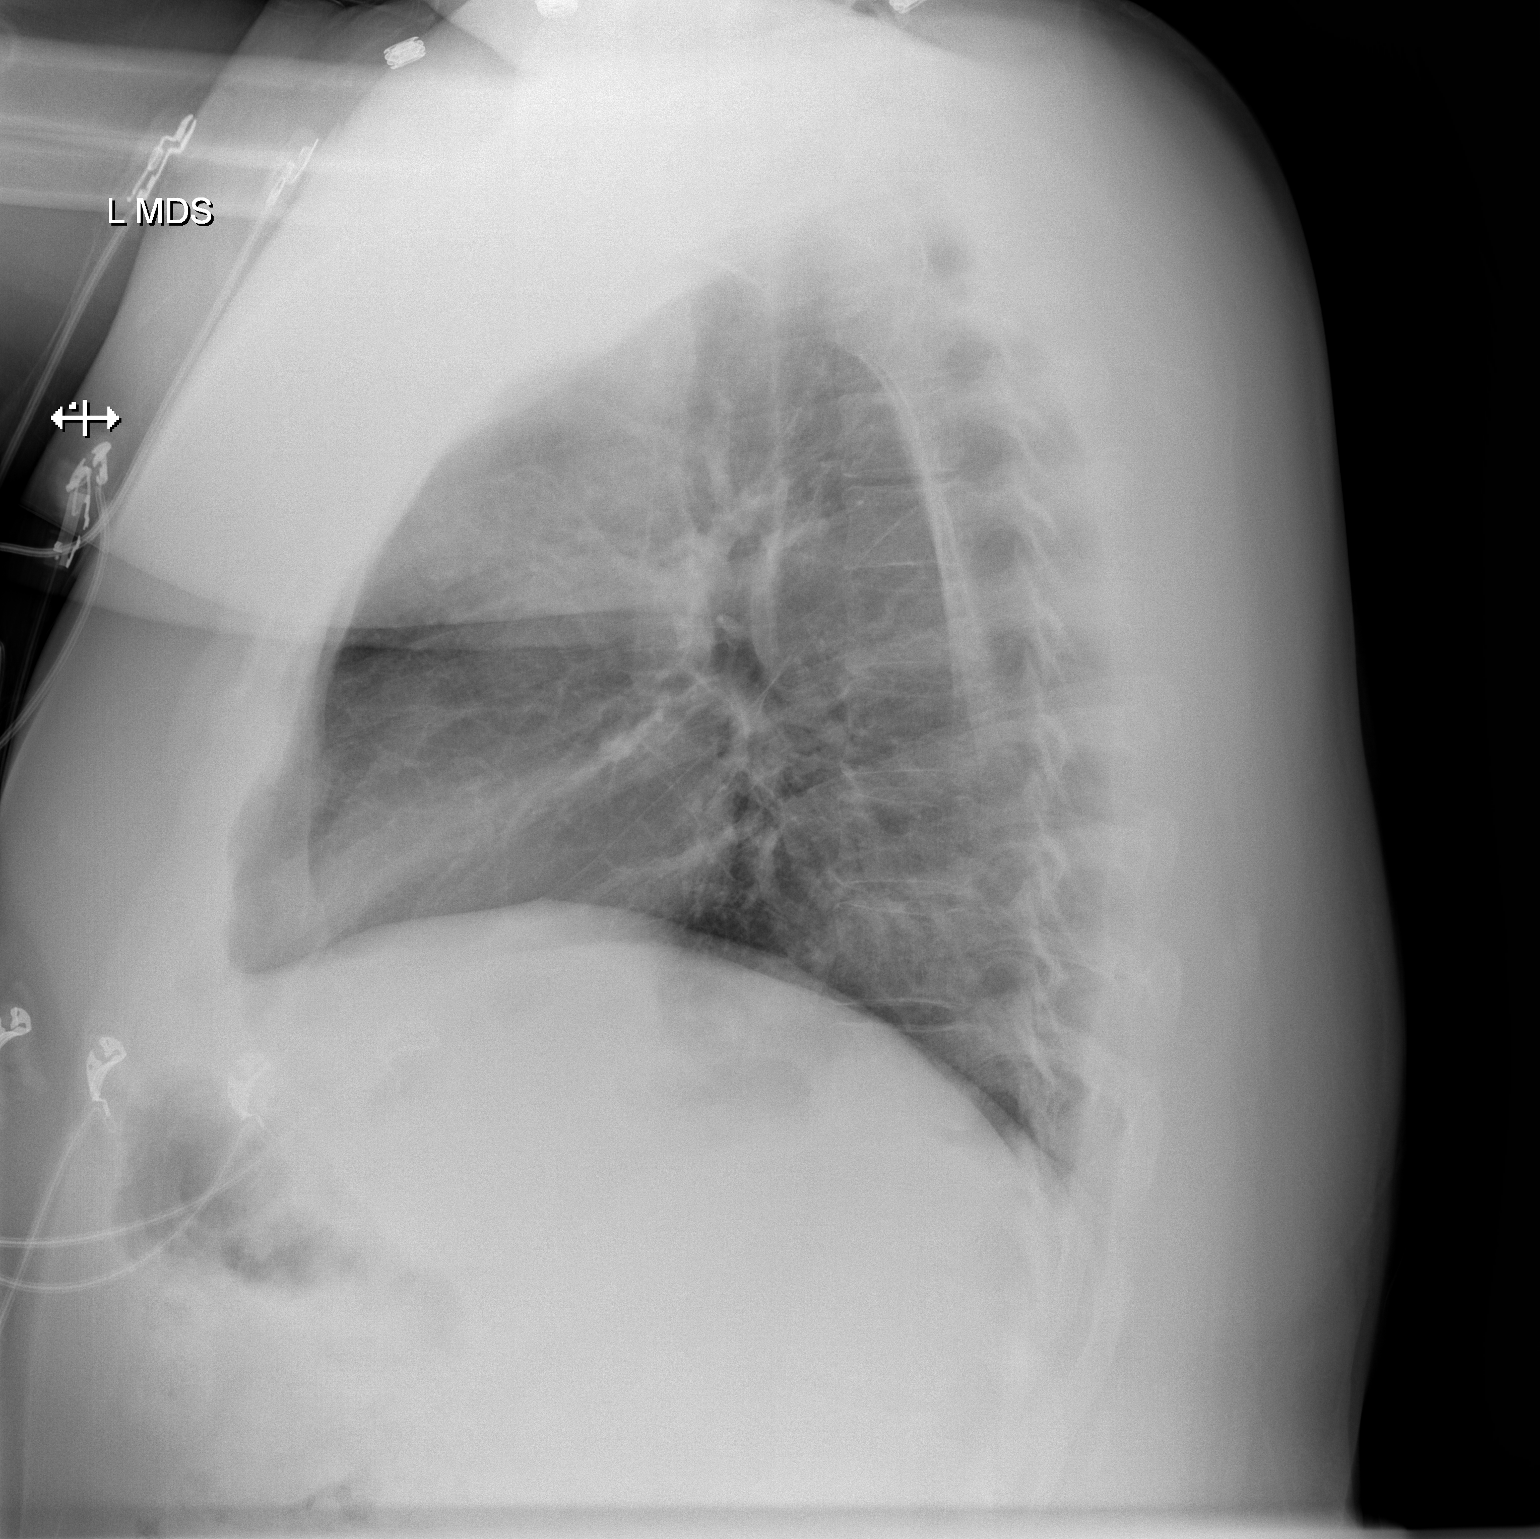

[2 of 2 positions shown; findings below may reference images not displayed]

FINDINGS: Lungs are clear. Heart size and pulmonary vascularity are normal. No
pneumothorax. No adenopathy. No bone lesions.
IMPRESSION: No edema or consolidation.

## 2020-06-24 ENCOUNTER — Encounter: Payer: Self-pay | Admitting: Gastroenterology

## 2020-08-06 ENCOUNTER — Other Ambulatory Visit: Payer: Self-pay

## 2020-08-06 ENCOUNTER — Ambulatory Visit (AMBULATORY_SURGERY_CENTER): Payer: BC Managed Care – PPO | Admitting: *Deleted

## 2020-08-06 VITALS — Ht 69.0 in | Wt 275.0 lb

## 2020-08-06 DIAGNOSIS — Z1211 Encounter for screening for malignant neoplasm of colon: Secondary | ICD-10-CM

## 2020-08-06 MED ORDER — PEG 3350-KCL-NA BICARB-NACL 420 G PO SOLR: 4000.0000 mL | Freq: Once | ORAL | 0 refills | Status: AC

## 2020-08-06 NOTE — Progress Notes (Signed)

## 2020-08-27 ENCOUNTER — Encounter: Payer: Self-pay | Admitting: Gastroenterology

## 2020-08-27 ENCOUNTER — Other Ambulatory Visit: Payer: Self-pay

## 2020-08-27 ENCOUNTER — Ambulatory Visit (AMBULATORY_SURGERY_CENTER): Payer: BC Managed Care – PPO | Admitting: Gastroenterology

## 2020-08-27 VITALS — BP 137/76 | HR 58 | Temp 98.7°F | Resp 23 | Ht 69.0 in | Wt 275.0 lb

## 2020-08-27 DIAGNOSIS — Z1211 Encounter for screening for malignant neoplasm of colon: Secondary | ICD-10-CM | POA: Diagnosis not present

## 2020-08-27 MED ORDER — SODIUM CHLORIDE 0.9 % IV SOLN: 500.0000 mL | Freq: Once | INTRAVENOUS | Status: DC

## 2020-08-27 NOTE — Op Note (Signed)
[facility] Patient Name: Jorge Walters Procedure Date: 08/27/2020 11:45 AM MRN: [id number] Endoscopist: Justice Britain , MD Age: 45 Referring MD:  Date of Birth: [birth year] Gender: Male Account #: [id number] Procedure:                Colonoscopy Indications:              Screening for colorectal malignant neoplasm, This                            is the patient's first colonoscopy Medicines:                Monitored Anesthesia Care Procedure:                Pre-Anesthesia Assessment:                           - Prior to the procedure, a History and Physical                            was performed, and patient medications and                            allergies were reviewed. The patient's tolerance of                            previous anesthesia was also reviewed. The risks                            and benefits of the procedure and the sedation                            options and risks were discussed with the patient.                            All questions were answered, and informed consent                            was obtained. Prior Anticoagulants: The patient has                            taken no previous anticoagulant or antiplatelet                            agents. ASA Grade Assessment: II - A patient with                            mild systemic disease. After reviewing the risks                            and benefits, the patient was deemed in                            satisfactory condition to undergo the procedure.  After obtaining informed consent, the colonoscope                            was passed under direct vision. Throughout the                            procedure, the patient's blood pressure, pulse, and                            oxygen saturations were monitored continuously. The                            Colonoscope was introduced through the anus and                            advanced to the 5 cm  into the ileum. The                            colonoscopy was performed without difficulty. The                            patient tolerated the procedure. The quality of the                            bowel preparation was adequate. The terminal ileum,                            ileocecal valve, appendiceal orifice, and rectum                            were photographed. Scope In: 11:59:00 AM Scope Out: 12:13:25 PM Scope Withdrawal Time: 0 hours 9 minutes 45 seconds  Total Procedure Duration: 0 hours 14 minutes 25 seconds  Findings:                 The digital rectal exam findings include                            hemorrhoids. Pertinent negatives include no                            palpable rectal lesions.                           A moderate amount of liquid stool was found in the                            entire colon, interfering with visualization.                            Lavage of the area was performed using copious                            amounts, resulting in clearance with adequate  visualization.                           The terminal ileum and ileocecal valve appeared                            normal.                           A few small-mouthed diverticula were found in the                            recto-sigmoid colon and sigmoid colon.                           Normal mucosa was found in the entire colon                            otherwise.                           Non-bleeding non-thrombosed internal hemorrhoids                            were found during retroflexion, during perianal                            exam and during digital exam. The hemorrhoids were                            Grade II (internal hemorrhoids that prolapse but                            reduce spontaneously). Complications:            No immediate complications. Estimated Blood Loss:     Estimated blood loss: none. Impression:               -  Hemorrhoids found on digital rectal exam.                           - Stool in the entire examined colon.                           - The examined portion of the ileum was normal.                           - Diverticulosis in the recto-sigmoid colon and in                            the sigmoid colon.                           - Normal mucosa in the entire examined colon                            otherwise.                           -  Non-bleeding non-thrombosed internal hemorrhoids. Recommendation:           - The patient will be observed post-procedure,                            until all discharge criteria are met.                           - Discharge patient to home.                           - Patient has a contact number available for                            emergencies. The signs and symptoms of potential                            delayed complications were discussed with the                            patient. Return to normal activities tomorrow.                            Written discharge instructions were provided to the                            patient.                           - High fiber diet.                           - Use FiberCon 1-2 tablets PO daily.                           - Continue present medications.                           - Repeat colonoscopy in 10 years for screening                            purposes.                           - The findings and recommendations were discussed                            with the patient.                           - The findings and recommendations were discussed                            with the patient's family. Justice Britain, MD 08/27/2020 12:17:59 PM

## 2020-08-27 NOTE — Progress Notes (Signed)
To PACU, VSS. Report to [email or web address] 

## 2020-08-27 NOTE — Patient Instructions (Signed)
Start a high fiber diet, use FiberCon 1-2 tablets po daily. Continue present medications. Repeat Colonoscopy in 10 years for screening purposes. YOU HAD AN ENDOSCOPIC PROCEDURE TODAY AT THE [facility]:   Refer to the procedure report that was given to you for any specific questions about what was found during the examination.  If the procedure report does not answer your questions, please call your gastroenterologist to clarify.  If you requested that your care partner not be given the details of your procedure findings, then the procedure report has been included in a sealed envelope for you to review at your convenience later.  YOU SHOULD EXPECT: Some feelings of bloating in the abdomen. Passage of more gas than usual.  Walking can help get rid of the air that was put into your GI tract during the procedure and reduce the bloating. If you had a lower endoscopy (such as a colonoscopy or flexible sigmoidoscopy) you may notice spotting of blood in your stool or on the toilet paper. If you underwent a bowel prep for your procedure, you may not have a normal bowel movement for a few days.  Please Note:  You might notice some irritation and congestion in your nose or some drainage.  This is from the oxygen used during your procedure.  There is no need for concern and it should clear up in a day or so.  SYMPTOMS TO REPORT IMMEDIATELY:  Following lower endoscopy (colonoscopy or flexible sigmoidoscopy):  Excessive amounts of blood in the stool  Significant tenderness or worsening of abdominal pains  Swelling of the abdomen that is new, acute  Fever of 100F or higher  For urgent or emergent issues, a gastroenterologist can be reached at any hour by calling [phone]. Do not use MyChart messaging for urgent concerns.    DIET:  We do recommend a small meal at first, but then you may proceed to your regular diet.  Drink plenty of fluids but you should avoid alcoholic beverages for 24  hours.  ACTIVITY:  You should plan to take it easy for the rest of today and you should NOT DRIVE or use heavy machinery until tomorrow (because of the sedation medicines used during the test).    FOLLOW UP: Our staff will call the number listed on your records 48-72 hours following your procedure to check on you and address any questions or concerns that you may have regarding the information given to you following your procedure. If we do not reach you, we will leave a message.  We will attempt to reach you two times.  During this call, we will ask if you have developed any symptoms of COVID 19. If you develop any symptoms (ie: fever, flu-like symptoms, shortness of breath, cough etc.) before then, please call [phone].  If you test positive for Covid 19 in the 2 weeks post procedure, please call and report this information to [place].    If any biopsies were taken you will be contacted by phone or by letter within the next 1-3 weeks.  Please call us at [phone] if you have not heard about the biopsies in 3 weeks.    SIGNATURES/CONFIDENTIALITY: You and/or your care partner have signed paperwork which will be entered into your electronic medical record.  These signatures attest to the fact that that the information above on your After Visit Summary has been reviewed and is understood.  Full responsibility of the confidentiality of this discharge information lies with you and/or  your care-partner.

## 2020-08-27 NOTE — Progress Notes (Signed)
Pt's states no medical or surgical changes since previsit or office visit.  ° °Cw vitals  °

## 2020-08-29 ENCOUNTER — Telehealth: Payer: Self-pay | Admitting: *Deleted

## 2020-08-29 NOTE — Telephone Encounter (Signed)
  Follow up Call-  Call back number 08/27/2020  Post procedure Call Back phone  # [phone]  Permission to leave phone message Yes  Some recent data might be hidden     Patient questions:  Do you have a fever, pain , or abdominal swelling? No. Pain Score  0 *  Have you tolerated food without any problems? Yes.    Have you been able to return to your normal activities? Yes.    Do you have any questions about your discharge instructions: Diet   No. Medications  No. Follow up visit  No.  Do you have questions or concerns about your Care? No.  Actions: * If pain score is 4 or above: No action needed, pain <4.  Have you developed a fever since your procedure? no  2.   Have you had an respiratory symptoms (SOB or cough) since your procedure? no  3.   Have you tested positive for COVID 19 since your procedure no  4.   Have you had any family members/close contacts diagnosed with the COVID 19 since your procedure?  no   If yes to any of these questions please route to Laverna Peace, RN and Karlton Lemon, RN

## 2021-04-23 ENCOUNTER — Encounter: Payer: BC Managed Care – PPO | Admitting: Adult Health

## 2021-04-24 ENCOUNTER — Encounter: Payer: Self-pay | Admitting: Adult Health

## 2021-04-24 ENCOUNTER — Ambulatory Visit (INDEPENDENT_AMBULATORY_CARE_PROVIDER_SITE_OTHER): Payer: BC Managed Care – PPO | Admitting: Adult Health

## 2021-04-24 VITALS — BP 120/82 | HR 86 | Ht 69.0 in | Wt 281.0 lb

## 2021-04-24 DIAGNOSIS — Z0001 Encounter for general adult medical examination with abnormal findings: Secondary | ICD-10-CM

## 2021-04-24 DIAGNOSIS — E668 Other obesity: Secondary | ICD-10-CM

## 2021-04-24 DIAGNOSIS — Z1159 Encounter for screening for other viral diseases: Secondary | ICD-10-CM

## 2021-04-24 DIAGNOSIS — Z114 Encounter for screening for human immunodeficiency virus [HIV]: Secondary | ICD-10-CM | POA: Diagnosis not present

## 2021-04-24 LAB — CBC WITH DIFFERENTIAL/PLATELET
Basophils Absolute: 0 10*3/uL (ref 0.0–0.1)
Basophils Relative: 0.6 % (ref 0.0–3.0)
Eosinophils Absolute: 0.1 10*3/uL (ref 0.0–0.7)
Eosinophils Relative: 2 % (ref 0.0–5.0)
HCT: 43.2 % (ref 39.0–52.0)
Hemoglobin: 14.3 g/dL (ref 13.0–17.0)
Lymphocytes Relative: 21.5 % (ref 12.0–46.0)
Lymphs Abs: 1.4 10*3/uL (ref 0.7–4.0)
MCHC: 33.1 g/dL (ref 30.0–36.0)
MCV: 94.5 fl (ref 78.0–100.0)
Monocytes Absolute: 0.6 10*3/uL (ref 0.1–1.0)
Monocytes Relative: 9.3 % (ref 3.0–12.0)
Neutro Abs: 4.5 10*3/uL (ref 1.4–7.7)
Neutrophils Relative %: 66.6 % (ref 43.0–77.0)
Platelets: 202 10*3/uL (ref 150.0–400.0)
RBC: 4.57 Mil/uL (ref 4.22–5.81)
RDW: 13.9 % (ref 11.5–15.5)
WBC: 6.7 10*3/uL (ref 4.0–10.5)

## 2021-04-24 LAB — LIPID PANEL
Cholesterol: 192 mg/dL (ref 0–200)
HDL: 51.8 mg/dL
LDL Cholesterol: 113 mg/dL — ABNORMAL HIGH (ref 0–99)
NonHDL: 140.6
Total CHOL/HDL Ratio: 4
Triglycerides: 138 mg/dL (ref 0.0–149.0)
VLDL: 27.6 mg/dL (ref 0.0–40.0)

## 2021-04-24 LAB — COMPREHENSIVE METABOLIC PANEL
ALT: 34 U/L (ref 0–53)
AST: 27 U/L (ref 0–37)
Albumin: 4.2 g/dL (ref 3.5–5.2)
Alkaline Phosphatase: 76 U/L (ref 39–117)
BUN: 12 mg/dL (ref 6–23)
CO2: 27 mEq/L (ref 19–32)
Calcium: 9.4 mg/dL (ref 8.4–10.5)
Chloride: 104 mEq/L (ref 96–112)
Creatinine, Ser: 0.87 mg/dL (ref 0.40–1.50)
GFR: 103.99 mL/min (ref >60.00)
Glucose, Bld: 102 mg/dL — ABNORMAL HIGH (ref 70–99)
Potassium: 4.2 mEq/L (ref 3.5–5.1)
Sodium: 138 mEq/L (ref 135–145)
Total Bilirubin: 0.5 mg/dL (ref 0.2–1.2)
Total Protein: 6.5 g/dL (ref 6.0–8.3)

## 2021-04-24 LAB — HEMOGLOBIN A1C: Hgb A1c MFr Bld: 5.5 % (ref 4.6–6.5)

## 2021-04-24 LAB — TSH: TSH: 3.42 u[IU]/mL (ref 0.35–5.50)

## 2021-04-24 MED ORDER — WEGOVY 0.25 MG/0.5ML ~~LOC~~ SOAJ: 0.2500 mg | SUBCUTANEOUS | 0 refills | Status: DC

## 2021-04-24 NOTE — Progress Notes (Signed)
? ?Subjective:  ? ? Patient ID: Jorge Walters, male    DOB: Apr 07, 1975, 46 y.o.   MRN: [id number] ? ?HPI ?Patient presents for yearly preventative medicine examination. He is a pleasant 46 year old male who  has a past medical history of Allergy, Chicken pox, and Obesity. ? ?Obesity - He is struggling to lose weight. Riding his pelaton 3-4 days a week. He is eating healthy ?Wt Readings from Last 3 Encounters:  ?04/24/21 281 lb (127.5 kg)  ?08/27/20 275 lb (124.7 kg)  ?08/06/20 275 lb (124.7 kg)  ? ?All immunizations and health maintenance protocols were reviewed with the patient and needed orders were placed. ? ?Appropriate screening laboratory values were ordered for the patient including screening of hyperlipidemia, renal function and hepatic function. ? ?Medication reconciliation,  past medical history, social history, problem list and allergies were reviewed in detail with the patient ? ?Goals were established with regard to weight loss, exercise, and  diet in compliance with medications ? ?He is up to date on routine colon cancer screening  ? ?Review of Systems  ?Constitutional: Negative.   ?HENT: Negative.    ?Eyes: Negative.   ?Respiratory: Negative.    ?Cardiovascular: Negative.   ?Gastrointestinal: Negative.   ?Endocrine: Negative.   ?Genitourinary: Negative.   ?Musculoskeletal: Negative.   ?Skin: Negative.   ?Allergic/Immunologic: Negative.   ?Neurological: Negative.   ?Hematological: Negative.   ?Psychiatric/Behavioral: Negative.    ?All other systems reviewed and are negative. ? ?Past Medical History:  ?Diagnosis Date  ? Allergy   ? Chicken pox   ? Obesity   ? ? ?Social History  ? ?Socioeconomic History  ? Marital status: Married  ?  Spouse name: Not on file  ? Number of children: Not on file  ? Years of education: Not on file  ? Highest education level: Not on file  ?Occupational History  ? Not on file  ?Tobacco Use  ? Smoking status: Never  ? Smokeless tobacco: Never  ?Substance and Sexual  Activity  ? Alcohol use: Yes  ?  Alcohol/week: 2.0 standard drinks  ?  Types: 1 Glasses of wine, 1 Cans of beer per week  ?  Comment: 'social"  ? Drug use: No  ? Sexual activity: Not on file  ?Other Topics Concern  ? Not on file  ?Social History Narrative  ? 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   ? Married   ? No children   ?   ? ?Social Determinants of Health  ? ?Financial Resource Strain: Not on file  ?Food Insecurity: Not on file  ?Transportation Needs: Not on file  ?Physical Activity: Not on file  ?Stress: Not on file  ?Social Connections: Not on file  ?Intimate Partner Violence: Not on file  ? ? ?History reviewed. No pertinent surgical history. ? ?Family History  ?Problem Relation Age of Onset  ? Stroke Father   ? Diabetes Father   ? Esophageal cancer Maternal Uncle   ? Heart attack Maternal Grandfather   ? Stomach cancer Neg Hx   ? Rectal cancer Neg Hx   ? Colon cancer Neg Hx   ? Colon polyps Neg Hx   ? ? ?No Known Allergies ? ?Current Outpatient Medications on File Prior to Visit  ?Medication Sig Dispense Refill  ? cetirizine (ZYRTEC) 10 MG tablet Take 10 mg by mouth daily.    ? GLUCOSAMINE-CHONDROITIN PO Take by mouth.    ? Multiple Vitamin (MULTIVITAMIN) capsule Take 1 capsule by  mouth daily.    ? Multiple Vitamins-Minerals (VITAMIN D3 COMPLETE PO) Take by mouth.    ? Omega-3 Fatty Acids (FISH OIL) 1000 MG CAPS Take by mouth.    ? OVER THE COUNTER MEDICATION Syllium husk    ? ?Current Facility-Administered Medications on File Prior to Visit  ?Medication Dose Route Frequency Provider Last Rate Last Admin  ? 0.9 %  sodium chloride infusion  500 mL Intravenous Once Mansouraty, Netty StarringGabriel Jr., MD      ? ? ?BP 120/82   Pulse 86   Ht 5\' 9"  (1.753 m)   Wt 281 lb (127.5 kg)   SpO2 96%   BMI 41.50 kg/m?  ? ? ?   ?Objective:  ? Physical Exam ?Vitals and nursing note reviewed.  ?Constitutional:   ?   General: He is not in acute distress. ?   Appearance: Normal appearance. He is  well-developed. He is obese.  ?HENT:  ?   Head: Normocephalic and atraumatic.  ?   Right Ear: Tympanic membrane, ear canal and external ear normal. There is no impacted cerumen.  ?   Left Ear: Tympanic membrane, ear canal and external ear normal. There is no impacted cerumen.  ?   Nose: Nose normal. No congestion or rhinorrhea.  ?   Mouth/Throat:  ?   Mouth: Mucous membranes are moist.  ?   Pharynx: Oropharynx is clear. No oropharyngeal exudate or posterior oropharyngeal erythema.  ?Eyes:  ?   General:     ?   Right eye: No discharge.     ?   Left eye: No discharge.  ?   Extraocular Movements: Extraocular movements intact.  ?   Conjunctiva/sclera: Conjunctivae normal.  ?   Pupils: Pupils are equal, round, and reactive to light.  ?Neck:  ?   Vascular: No carotid bruit.  ?   Trachea: No tracheal deviation.  ?Cardiovascular:  ?   Rate and Rhythm: Normal rate and regular rhythm.  ?   Pulses: Normal pulses.  ?   Heart sounds: Normal heart sounds. No murmur heard. ?  No friction rub. No gallop.  ?Pulmonary:  ?   Effort: Pulmonary effort is normal. No respiratory distress.  ?   Breath sounds: Normal breath sounds. No stridor. No wheezing, rhonchi or rales.  ?Chest:  ?   Chest wall: No tenderness.  ?Abdominal:  ?   General: Bowel sounds are normal. There is no distension.  ?   Palpations: Abdomen is soft. There is no mass.  ?   Tenderness: There is no abdominal tenderness. There is no right CVA tenderness, left CVA tenderness, guarding or rebound.  ?   Hernia: No hernia is present.  ?Musculoskeletal:     ?   General: No swelling, tenderness, deformity or signs of injury. Normal range of motion.  ?   Right lower leg: No edema.  ?   Left lower leg: No edema.  ?Lymphadenopathy:  ?   Cervical: No cervical adenopathy.  ?Skin: ?   General: Skin is warm and dry.  ?   Capillary Refill: Capillary refill takes less than 2 seconds.  ?   Coloration: Skin is not jaundiced or pale.  ?   Findings: No bruising, erythema, lesion or rash.   ?Neurological:  ?   General: No focal deficit present.  ?   Mental Status: He is alert and oriented to person, place, and time.  ?   Cranial Nerves: No cranial nerve deficit.  ?   Sensory: No  sensory deficit.  ?   Motor: No weakness.  ?   Coordination: Coordination normal.  ?   Gait: Gait normal.  ?   Deep Tendon Reflexes: Reflexes normal.  ?Psychiatric:     ?   Mood and Affect: Mood normal.     ?   Behavior: Behavior normal.     ?   Thought Content: Thought content normal.     ?   Judgment: Judgment normal.  ? ?   ?Assessment & Plan:  ?1. Encounter for general adult medical examination with abnormal findings ? ?- CBC with Differential/Platelet; Future ?- Comprehensive metabolic panel; Future ?- Hemoglobin A1c; Future ?- Lipid panel; Future ?- TSH; Future ? ?2. Other obesity ?- Continue with lifestyle modifications.  ?- He is open to trying medication. Will send in [facility] to see if it is covered.  ?- CBC with Differential/Platelet; Future ?- Comprehensive metabolic panel; Future ?- Hemoglobin A1c; Future ?- Lipid panel; Future ?- TSH; Future ?- Semaglutide-Weight Management (WEGOVY) 0.25 MG/0.5ML SOAJ; Inject 0.25 mg into the skin once a week.  Dispense: 2 mL; Refill: 0 ? ?3. Encounter for screening for HIV ? ?- HIV Antibody (routine testing w rflx); Future ? ?4. Need for hepatitis C screening test ? ?- Hep C Antibody; Future ? ?Shirline Frees, NP ? ?

## 2021-04-24 NOTE — Patient Instructions (Signed)
Health Maintenance Due  ?Topic Date Due  ? Hepatitis C Screening  Never done  ? COVID-19 Vaccine (4 - Booster for Pfizer series) 02/01/2020  ? INFLUENZA VACCINE  09/16/2020  ? ? ?Depression screen [facility] 2/9 03/15/2020  ?Decreased Interest 0  ?Down, Depressed, Hopeless 0  ?PHQ - 2 Score 0  ? ? ?

## 2021-04-25 LAB — HIV ANTIBODY (ROUTINE TESTING W REFLEX): HIV 1&2 Ab, 4th Generation: NONREACTIVE

## 2021-04-25 LAB — HEPATITIS C ANTIBODY
Hepatitis C Ab: NONREACTIVE
SIGNAL TO CUT-OFF: 0.04 (ref <1.00)

## 2021-04-28 ENCOUNTER — Telehealth: Payer: Self-pay | Admitting: Adult Health

## 2021-04-28 NOTE — Telephone Encounter (Signed)
Patient called in requesting to speak with Jorge Walters. The call will be about something that they discussed on Friday. ? ?Please advise. ? ?

## 2021-04-29 NOTE — Telephone Encounter (Signed)
Spoke to pt and he stated that insurance told him that Reginal Lutes is not approved.  Pt is wanting to know if there an alternative.  ?

## 2021-04-30 NOTE — Telephone Encounter (Signed)
Patient notified of update  and verbalized understanding. 

## 2021-05-26 NOTE — Telephone Encounter (Signed)
LVM notifying pt that PCP is out of office until 4/18 & will review pt decision at that time. ?

## 2021-05-26 NOTE — Telephone Encounter (Signed)
Pt is calling and insurance will not cover wegovy platient would like new rx phentermine to be sent to  ?[facility] [id number] - [place], [place] - [address] Phone:  [phone]  ?Fax:  [phone]  ?  ?  ?

## 2021-06-03 ENCOUNTER — Other Ambulatory Visit: Payer: Self-pay | Admitting: Adult Health

## 2021-06-03 MED ORDER — PHENTERMINE HCL 15 MG PO CAPS
15.0000 mg | ORAL_CAPSULE | ORAL | 0 refills | Status: DC
Start: 2021-06-03 — End: 2021-07-04

## 2021-06-04 NOTE — Telephone Encounter (Signed)
Spoke to pt and advised of message below. Pt stated that he was planning on paying out of pocket for the Rx. Pt stated he will call back and scheduled 30 day f/u when he starts taking medication.  ?

## 2021-06-05 ENCOUNTER — Telehealth: Payer: Self-pay | Admitting: Adult Health

## 2021-06-05 NOTE — Telephone Encounter (Signed)
Pt wants to speak with nurse regarding his phentermine 15 MG capsule ?

## 2021-06-05 NOTE — Telephone Encounter (Signed)
Patient notified of update  and verbalized understanding. 

## 2021-06-05 NOTE — Telephone Encounter (Signed)
Spoke to pt and he stated that the phentermine makes him feels like he's on "speed". Pt stated that he has a hx of his heart racing when drinking coffee. PT stated that he took the phentermine first took 2 sips of coffee and felt "jumpy" pt stopped drinking the coffee. Pt wants to know if phentermine will make you feel this way. Pt stated if so he will just cut his coffee in the morning unless he really needs it and drink 1 cup in the afternoon.  ?

## 2021-07-04 ENCOUNTER — Telehealth (INDEPENDENT_AMBULATORY_CARE_PROVIDER_SITE_OTHER): Payer: BC Managed Care – PPO | Admitting: Adult Health

## 2021-07-04 ENCOUNTER — Encounter: Payer: Self-pay | Admitting: Adult Health

## 2021-07-04 VITALS — Ht 69.0 in | Wt 272.0 lb

## 2021-07-04 DIAGNOSIS — E668 Other obesity: Secondary | ICD-10-CM | POA: Diagnosis not present

## 2021-07-04 MED ORDER — PHENTERMINE HCL 30 MG PO CAPS: 30.0000 mg | ORAL_CAPSULE | ORAL | 2 refills | Status: DC

## 2021-07-04 NOTE — Progress Notes (Signed)
Virtual Visit via Video Note  I connected with Tera Helper on 07/04/21 at 11:45 AM EDT by a video enabled telemedicine application and verified that I am speaking with the correct person using two identifiers.  Location patient: home Location provider:work or home office Persons participating in the virtual visit: patient, provider  I discussed the limitations of evaluation and management by telemedicine and the availability of in person appointments. The patient expressed understanding and agreed to proceed.   HPI: 46 year old male who is being evaluated today for 1 month follow-up regarding weight loss management.  He was started on phentermine 15 mg daily roughly 1 month ago.  He had a few hours of feeling jittery on the first day but has not had any side effects since.  He has been able to lose weight and is down to 272 lbs.   Wt Readings from Last 3 Encounters:  07/04/21 272 lb (123.4 kg)  04/24/21 281 lb (127.5 kg)  08/27/20 275 lb (124.7 kg)      ROS: See pertinent positives and negatives per HPI.  Past Medical History:  Diagnosis Date   Allergy    Chicken pox    Obesity     History reviewed. No pertinent surgical history.  Family History  Problem Relation Age of Onset   Stroke Father    Diabetes Father    Esophageal cancer Maternal Uncle    Heart attack Maternal Grandfather    Stomach cancer Neg Hx    Rectal cancer Neg Hx    Colon cancer Neg Hx    Colon polyps Neg Hx        Current Outpatient Medications:    cetirizine (ZYRTEC) 10 MG tablet, Take 10 mg by mouth daily., Disp: , Rfl:    GLUCOSAMINE-CHONDROITIN PO, Take by mouth., Disp: , Rfl:    Multiple Vitamin (MULTIVITAMIN) capsule, Take 1 capsule by mouth daily., Disp: , Rfl:    Omega-3 Fatty Acids (FISH OIL) 1000 MG CAPS, Take by mouth., Disp: , Rfl:    OVER THE COUNTER MEDICATION, Syllium husk, Disp: , Rfl:    phentermine 15 MG capsule, Take 1 capsule (15 mg total) by mouth every morning., Disp: 30  capsule, Rfl: 0  Current Facility-Administered Medications:    0.9 %  sodium chloride infusion, 500 mL, Intravenous, Once, Mansouraty, Netty Starring., MD  EXAMTheodoro Kos per patient if applicable:  GENERAL: alert, oriented, appears well and in no acute distress  HEENT: atraumatic, conjunttiva clear, no obvious abnormalities on inspection of external nose and ears  NECK: normal movements of the head and neck  LUNGS: on inspection no signs of respiratory distress, breathing rate appears normal, no obvious gross SOB, gasping or wheezing  CV: no obvious cyanosis  MS: moves all visible extremities without noticeable abnormality  PSYCH/NEURO: pleasant and cooperative, no obvious depression or anxiety, speech and thought processing grossly intact  ASSESSMENT AND PLAN:  Discussed the following assessment and plan:  1. Other obesity - will increase dose of phentermine to 30 mg x 3 months. Follow up at that point  - phentermine 30 MG capsule; Take 1 capsule (30 mg total) by mouth every morning.  Dispense: 30 capsule; Refill: 2      I discussed the assessment and treatment plan with the patient. The patient was provided an opportunity to ask questions and all were answered. The patient agreed with the plan and demonstrated an understanding of the instructions.   The patient was advised to call back or seek an  in-person evaluation if the symptoms worsen or if the condition fails to improve as anticipated.   Shirline Frees, NP

## 2021-07-11 ENCOUNTER — Telehealth: Payer: Self-pay

## 2021-07-11 NOTE — Telephone Encounter (Signed)
---  Caller states he recently began taking 30 mg Phentermine on Saturday after being on 15mg  for a month. On Wednesday night he began feeling lightheaded, he had one incident at that time where he felt very lightheaded. He has felt lightheaded while walking around the past few days. He felt earlier today like he was 'tweaky', felt like something was making his eyes twitch and increased anxiety. Denies SOB or CP. States he went to pharmacy and took Sudafed because he had some sinus pressure that might of caused lightheadedness.  07/11/2021 4:19:37 PM See PCP within 24 Hours 07/13/2021, RN, Letta Pate  Comments User: [employer], RN Date/Time Glee Arvin Time): 07/11/2021 4:21:37 PM Caller states he was calling to check with his PCP about discontinuing weight loss medication Phentermine, states he will call office back to see if they can get him in otherwise will D/C medication and f/u on Monday. Informed patient that I was unable to tell him whether or not to D/C medication and to seek care at [facility] for symptoms, caller verbalizes understanding.  Referrals REFERRED TO PCP OFFICE  Pt has appt with PCP on May 30

## 2021-07-15 ENCOUNTER — Telehealth (INDEPENDENT_AMBULATORY_CARE_PROVIDER_SITE_OTHER): Payer: BC Managed Care – PPO | Admitting: Adult Health

## 2021-07-15 ENCOUNTER — Encounter: Payer: Self-pay | Admitting: Adult Health

## 2021-07-15 VITALS — Ht 69.0 in | Wt 272.0 lb

## 2021-07-15 DIAGNOSIS — E668 Other obesity: Secondary | ICD-10-CM | POA: Diagnosis not present

## 2021-07-15 MED ORDER — PHENTERMINE HCL 15 MG PO CAPS: 15.0000 mg | ORAL_CAPSULE | ORAL | 2 refills | Status: DC

## 2021-07-15 NOTE — Progress Notes (Signed)
Virtual Visit via Video Note  I connected with Jorge Walters  on 07/15/21 at 11:30 AM EDT by a video enabled telemedicine application and verified that I am speaking with the correct person using two identifiers.  Location patient: home Location provider:work or home office Persons participating in the virtual visit: patient, provider  I discussed the limitations of evaluation and management by telemedicine and the availability of in person appointments. The patient expressed understanding and agreed to proceed.   HPI: 46 year old male who is being evaluated today for follow-up after starting phentermine for weight loss management.  He was last seen on 07/04/2021 at which time we increased his dose from 15 mg to 30 mg daily.  Since starting the increased dose he developed headache, dizziness, lightheadedness, sinus congestion, and eye twitching.  He subsequently stopped the phentermine and his symptoms resolved.  He would like to go back on the 15 mg dose.   ROS: See pertinent positives and negatives per HPI.  Past Medical History:  Diagnosis Date   Allergy    Chicken pox    Obesity     History reviewed. No pertinent surgical history.  Family History  Problem Relation Age of Onset   Stroke Father    Diabetes Father    Esophageal cancer Maternal Uncle    Heart attack Maternal Grandfather    Stomach cancer Neg Hx    Rectal cancer Neg Hx    Colon cancer Neg Hx    Colon polyps Neg Hx        Current Outpatient Medications:    cetirizine (ZYRTEC) 10 MG tablet, Take 10 mg by mouth daily., Disp: , Rfl:    GLUCOSAMINE-CHONDROITIN PO, Take by mouth., Disp: , Rfl:    Multiple Vitamin (MULTIVITAMIN) capsule, Take 1 capsule by mouth daily., Disp: , Rfl:    Omega-3 Fatty Acids (FISH OIL) 1000 MG CAPS, Take by mouth., Disp: , Rfl:    OVER THE COUNTER MEDICATION, Syllium husk, Disp: , Rfl:    phentermine 30 MG capsule, Take 1 capsule (30 mg total) by mouth every morning., Disp: 30  capsule, Rfl: 2  Current Facility-Administered Medications:    0.9 %  sodium chloride infusion, 500 mL, Intravenous, Once, Mansouraty, Netty Starring., MD  EXAMTheodoro Kos per patient if applicable:  GENERAL: alert, oriented, appears well and in no acute distress  HEENT: atraumatic, conjunttiva clear, no obvious abnormalities on inspection of external nose and ears  NECK: normal movements of the head and neck  LUNGS: on inspection no signs of respiratory distress, breathing rate appears normal, no obvious gross SOB, gasping or wheezing  CV: no obvious cyanosis  MS: moves all visible extremities without noticeable abnormality  PSYCH/NEURO: pleasant and cooperative, no obvious depression or anxiety, speech and thought processing grossly intact  ASSESSMENT AND PLAN:  Discussed the following assessment and plan:  1. Other obesity  - phentermine 15 MG capsule; Take 1 capsule (15 mg total) by mouth every morning.  Dispense: 30 capsule; Refill: 2     I discussed the assessment and treatment plan with the patient. The patient was provided an opportunity to ask questions and all were answered. The patient agreed with the plan and demonstrated an understanding of the instructions.   The patient was advised to call back or seek an in-person evaluation if the symptoms worsen or if the condition fails to improve as anticipated.   Shirline Frees, NP

## 2021-07-21 ENCOUNTER — Encounter: Payer: Self-pay | Admitting: Adult Health

## 2021-10-10 ENCOUNTER — Encounter: Payer: Self-pay | Admitting: Adult Health

## 2021-10-10 ENCOUNTER — Ambulatory Visit (INDEPENDENT_AMBULATORY_CARE_PROVIDER_SITE_OTHER): Payer: BC Managed Care – PPO | Admitting: Adult Health

## 2021-10-10 VITALS — BP 112/72 | HR 76 | Temp 98.0°F | Ht 69.0 in | Wt 262.0 lb

## 2021-10-10 DIAGNOSIS — E668 Other obesity: Secondary | ICD-10-CM

## 2021-10-10 MED ORDER — PHENTERMINE HCL 15 MG PO CAPS: 15.0000 mg | ORAL_CAPSULE | ORAL | 2 refills | Status: DC

## 2021-10-10 NOTE — Progress Notes (Signed)
Subjective:    Patient ID: Jorge Walters, male    DOB: [birth year], 46 y.o.   MRN: [id number]  HPI  46 year old male who  has a past medical history of Allergy, Chicken pox, and Obesity.  He presents to the office today for follow-up regarding weight loss management.  Currently managed with phentermine 15 mg daily.  He has been tolerating this dose well without side effects.  In the past we have tried increasing him to 30 mg but this caused him to experience headaches, dizziness, lightheadedness and eye twitching.  He has been exercising with both strength training and aerobic exercise and is eating healthy.   He has been able to lose about 19 pounds over the last 5 months.   Wt Readings from Last 5 Encounters:  10/10/21 262 lb (118.8 kg)  07/15/21 272 lb (123.4 kg)  07/04/21 272 lb (123.4 kg)  04/24/21 281 lb (127.5 kg)  08/27/20 275 lb (124.7 kg)   Review of Systems See HPI   Past Medical History:  Diagnosis Date   Allergy    Chicken pox    Obesity     Social History   Socioeconomic History   Marital status: Married    Spouse name: Not on file   Number of children: Not on file   Years of education: Not on file   Highest education level: Bachelor's degree (e.g., BA, AB, BS)  Occupational History   Not on file  Tobacco Use   Smoking status: Never   Smokeless tobacco: Never  Substance and Sexual Activity   Alcohol use: Yes    Alcohol/week: 2.0 standard drinks of alcohol    Types: 1 Glasses of wine, 1 Cans of beer per week    Comment: 'social"   Drug use: No   Sexual activity: Not on file  Other Topics Concern   Not on file  Social History Narrative   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    Married    No children       Social Determinants of Health   Financial Resource Strain: Low Risk  (10/07/2021)   Overall Financial Resource Strain (CARDIA)    Difficulty of Paying Living Expenses: Not hard at all  Food Insecurity: No  Food Insecurity (10/07/2021)   Hunger Vital Sign    Worried About Running Out of Food in the Last Year: Never true    Ran Out of Food in the Last Year: Never true  Transportation Needs: No Transportation Needs (10/07/2021)   PRAPARE - Administrator, Civil Service (Medical): No    Lack of Transportation (Non-Medical): No  Physical Activity: Sufficiently Active (10/07/2021)   Exercise Vital Sign    Days of Exercise per Week: 5 days    Minutes of Exercise per Session: 60 min  Stress: Stress Concern Present (10/07/2021)   [employer] of Occupational Health - Occupational Stress Questionnaire    Feeling of Stress : To some extent  Social Connections: Moderately Isolated (10/07/2021)   Social Connection and Isolation Panel [NHANES]    Frequency of Communication with Friends and Family: More than three times a week    Frequency of Social Gatherings with Friends and Family: Twice a week    Attends Religious Services: Never    Database administrator or Organizations: No    Attends Engineer, structural: Not on file    Marital Status: Married  Catering manager Violence: Not on file  History reviewed. No pertinent surgical history.  Family History  Problem Relation Age of Onset   Stroke Father    Diabetes Father    Esophageal cancer Maternal Uncle    Heart attack Maternal Grandfather    Stomach cancer Neg Hx    Rectal cancer Neg Hx    Colon cancer Neg Hx    Colon polyps Neg Hx     No Known Allergies  Current Outpatient Medications on File Prior to Visit  Medication Sig Dispense Refill   GLUCOSAMINE-CHONDROITIN PO Take by mouth.     Multiple Vitamin (MULTIVITAMIN) capsule Take 1 capsule by mouth daily.     Omega-3 Fatty Acids (FISH OIL) 1000 MG CAPS Take by mouth.     OVER THE COUNTER MEDICATION Syllium husk     phentermine 15 MG capsule Take 1 capsule (15 mg total) by mouth every morning. 30 capsule 2   Current Facility-Administered Medications on File  Prior to Visit  Medication Dose Route Frequency Provider Last Rate Last Admin   0.9 %  sodium chloride infusion  500 mL Intravenous Once Mansouraty, Netty Starring., MD        BP 112/72   Pulse 76   Temp 98 F (36.7 C) (Oral)   Ht 5\' 9"  (1.753 m)   Wt 262 lb (118.8 kg)   SpO2 97%   BMI 38.69 kg/m       Objective:   Physical Exam Vitals and nursing note reviewed.  Constitutional:      Appearance: Normal appearance. He is obese.  Cardiovascular:     Rate and Rhythm: Normal rate and regular rhythm.     Pulses: Normal pulses.     Heart sounds: Normal heart sounds.  Pulmonary:     Effort: Pulmonary effort is normal.     Breath sounds: Normal breath sounds.  Musculoskeletal:        General: Normal range of motion.  Skin:    General: Skin is warm and dry.  Neurological:     General: No focal deficit present.     Mental Status: He is alert and oriented to person, place, and time.  Psychiatric:        Mood and Affect: Mood normal.        Behavior: Behavior normal.        Thought Content: Thought content normal.        Judgment: Judgment normal.       Assessment & Plan:  1. Other obesity - Keep doing what he is going. Congratulated on weight loss  - Follow up in three months  - phentermine 15 MG capsule; Take 1 capsule (15 mg total) by mouth every morning.  Dispense: 30 capsule; Refill: 2  , NP

## 2021-11-05 DIAGNOSIS — L720 Epidermal cyst: Secondary | ICD-10-CM | POA: Diagnosis not present

## 2021-11-05 DIAGNOSIS — D239 Other benign neoplasm of skin, unspecified: Secondary | ICD-10-CM | POA: Diagnosis not present

## 2022-01-07 ENCOUNTER — Encounter: Payer: Self-pay | Admitting: Adult Health

## 2022-01-07 ENCOUNTER — Telehealth (INDEPENDENT_AMBULATORY_CARE_PROVIDER_SITE_OTHER): Payer: BC Managed Care – PPO | Admitting: Adult Health

## 2022-01-07 VITALS — Ht 69.0 in | Wt 260.0 lb

## 2022-01-07 DIAGNOSIS — E668 Other obesity: Secondary | ICD-10-CM | POA: Diagnosis not present

## 2022-01-07 MED ORDER — PHENTERMINE HCL 15 MG PO CAPS: 15.0000 mg | ORAL_CAPSULE | ORAL | 0 refills | Status: DC

## 2022-01-07 NOTE — Progress Notes (Signed)
Virtual Visit via Video Note  I connected with Tera Helper on 01/07/22 at 10:00 AM EST by a video enabled telemedicine application and verified that I am speaking with the correct person using two identifiers.  Location patient: home Location provider:work or home office Persons participating in the virtual visit: patient, provider  I discussed the limitations of evaluation and management by telemedicine and the availability of in person appointments. The patient expressed understanding and agreed to proceed.   HPI:  46 year old male who  has a past medical history of Allergy, Chicken pox, and Obesity.  Weight today for weight loss management.  He currently managed with phentermine 15 mg daily.  He has been tolerating this dose well without side effects.  In the past we tried increasing him to 30 mg daily but this caused him to experience headaches, dizziness, lightheadedness, and eye twitching.  He has been exercising with both strength training and aerobic exercise and is trying to eat healthy but has been traveling a lot over the last month and has not been able to maintain a diet.   He is interested in trying the 30 mg again    Wt Readings from Last 3 Encounters:  01/07/22 260 lb (117.9 kg)  10/10/21 262 lb (118.8 kg)  07/15/21 272 lb (123.4 kg)    ROS: See pertinent positives and negatives per HPI.  Past Medical History:  Diagnosis Date   Allergy    Chicken pox    Obesity     History reviewed. No pertinent surgical history.  Family History  Problem Relation Age of Onset   Stroke Father    Diabetes Father    Esophageal cancer Maternal Uncle    Heart attack Maternal Grandfather    Stomach cancer Neg Hx    Rectal cancer Neg Hx    Colon cancer Neg Hx    Colon polyps Neg Hx        Current Outpatient Medications:    GLUCOSAMINE-CHONDROITIN PO, Take by mouth., Disp: , Rfl:    Multiple Vitamin (MULTIVITAMIN) capsule, Take 1 capsule by mouth daily., Disp: , Rfl:     Omega-3 Fatty Acids (FISH OIL) 1000 MG CAPS, Take by mouth., Disp: , Rfl:    OVER THE COUNTER MEDICATION, Syllium husk, Disp: , Rfl:    phentermine 15 MG capsule, Take 1 capsule (15 mg total) by mouth every morning., Disp: 30 capsule, Rfl: 2  Current Facility-Administered Medications:    0.9 %  sodium chloride infusion, 500 mL, Intravenous, Once, Mansouraty, Netty Starring., MD  EXAMTheodoro Kos per patient if applicable:  GENERAL: alert, oriented, appears well and in no acute distress  HEENT: atraumatic, conjunttiva clear, no obvious abnormalities on inspection of external nose and ears  NECK: normal movements of the head and neck  LUNGS: on inspection no signs of respiratory distress, breathing rate appears normal, no obvious gross SOB, gasping or wheezing  CV: no obvious cyanosis  MS: moves all visible extremities without noticeable abnormality  PSYCH/NEURO: pleasant and cooperative, no obvious depression or anxiety, speech and thought processing grossly intact  ASSESSMENT AND PLAN:  Discussed the following assessment and plan:  1. Other obesity - He has 30 mg dose left over and will try that to see if side effects come back. Wills end in 30 mg dose incase he cannot handle the 30 mg. He will send me a mychart message to let me know how he does - phentermine 15 MG capsule; Take 1 capsule (15 mg  total) by mouth every morning.  Dispense: 30 capsule; Refill: 0      I discussed the assessment and treatment plan with the patient. The patient was provided an opportunity to ask questions and all were answered. The patient agreed with the plan and demonstrated an understanding of the instructions.   The patient was advised to call back or seek an in-person evaluation if the symptoms worsen or if the condition fails to improve as anticipated.   Shirline Frees, NP

## 2022-01-16 ENCOUNTER — Encounter: Payer: Self-pay | Admitting: Adult Health

## 2022-01-16 NOTE — Telephone Encounter (Signed)
FYI

## 2022-01-20 ENCOUNTER — Ambulatory Visit (INDEPENDENT_AMBULATORY_CARE_PROVIDER_SITE_OTHER): Payer: BC Managed Care – PPO | Admitting: Adult Health

## 2022-01-20 VITALS — BP 100/80 | HR 89 | Temp 97.5°F | Ht 69.0 in | Wt 259.0 lb

## 2022-01-20 DIAGNOSIS — R209 Unspecified disturbances of skin sensation: Secondary | ICD-10-CM

## 2022-01-20 NOTE — Progress Notes (Signed)
Subjective:    Patient ID: Jorge Walters, male    DOB: 12-05-1975, 46 y.o.   MRN: [id number]  HPI 46 year old male who  has a past medical history of Allergy, Chicken pox, and Obesity.  He presents to the office today for coldness in his feet. Symptoms started about 10 days ago after he went to the [place]/  football game and it was cold out and he noticed his feet were cold. Over the next week his symptoms did not resolve.  He came off phentermine 4 days ago and may have had some mild improvement.   He denies pain or numbness " just doesn't feel as vibrant"   Review of Systems See HPI   Past Medical History:  Diagnosis Date   Allergy    Chicken pox    Obesity     Social History   Socioeconomic History   Marital status: Married    Spouse name: Not on file   Number of children: Not on file   Years of education: Not on file   Highest education level: Bachelor's degree (e.g., BA, AB, BS)  Occupational History   Not on file  Tobacco Use   Smoking status: Never   Smokeless tobacco: Never  Substance and Sexual Activity   Alcohol use: Yes    Alcohol/week: 2.0 standard drinks of alcohol    Types: 1 Glasses of wine, 1 Cans of beer per week    Comment: 'social"   Drug use: No   Sexual activity: Not on file  Other Topics Concern   Not on file  Social History Narrative   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    Married    No children       Social Determinants of Health   Financial Resource Strain: Low Risk  (10/07/2021)   Overall Financial Resource Strain (CARDIA)    Difficulty of Paying Living Expenses: Not hard at all  Food Insecurity: No Food Insecurity (10/07/2021)   Hunger Vital Sign    Worried About Running Out of Food in the Last Year: Never true    Ran Out of Food in the Last Year: Never true  Transportation Needs: No Transportation Needs (10/07/2021)   PRAPARE - Hydrologist (Medical): No    Lack  of Transportation (Non-Medical): No  Physical Activity: Sufficiently Active (10/07/2021)   Exercise Vital Sign    Days of Exercise per Week: 5 days    Minutes of Exercise per Session: 60 min  Stress: Stress Concern Present (10/07/2021)   [place]    Feeling of Stress : To some extent  Social Connections: Moderately Isolated (10/07/2021)   Social Connection and Isolation Panel [NHANES]    Frequency of Communication with Friends and Family: More than three times a week    Frequency of Social Gatherings with Friends and Family: Twice a week    Attends Religious Services: Never    Marine scientist or Organizations: No    Attends Music therapist: Not on file    Marital Status: Married  Human resources officer Violence: Not on file    No past surgical history on file.  Family History  Problem Relation Age of Onset   Stroke Father    Diabetes Father    Esophageal cancer Maternal Uncle    Heart attack Maternal Grandfather    Stomach cancer Neg Hx    Rectal  cancer Neg Hx    Colon cancer Neg Hx    Colon polyps Neg Hx     No Known Allergies  Current Outpatient Medications on File Prior to Visit  Medication Sig Dispense Refill   GLUCOSAMINE-CHONDROITIN PO Take by mouth.     Multiple Vitamin (MULTIVITAMIN) capsule Take 1 capsule by mouth daily.     Omega-3 Fatty Acids (FISH OIL) 1000 MG CAPS Take by mouth.     OVER THE COUNTER MEDICATION Syllium husk     phentermine 15 MG capsule Take 1 capsule (15 mg total) by mouth every morning. 30 capsule 0   Current Facility-Administered Medications on File Prior to Visit  Medication Dose Route Frequency Provider Last Rate Last Admin   0.9 %  sodium chloride infusion  500 mL Intravenous Once Mansouraty, Netty Starring., MD        BP 100/80   Pulse 89   Temp (!) 97.5 F (36.4 C) (Oral)   Ht 5\' 9"  (1.753 m)   Wt 259 lb (117.5 kg)   SpO2 98%   BMI 38.25 kg/m        Objective:   Physical Exam Vitals and nursing note reviewed.  Constitutional:      Appearance: Normal appearance.  Cardiovascular:     Rate and Rhythm: Normal rate and regular rhythm.     Pulses:          Popliteal pulses are 2+ on the right side and 2+ on the left side.       Dorsalis pedis pulses are 2+ on the right side and 2+ on the left side.       Posterior tibial pulses are 2+ on the right side and 2+ on the left side.  Musculoskeletal:        General: Normal range of motion.  Skin:    General: Skin is warm and dry.     Capillary Refill: Capillary refill takes less than 2 seconds.  Neurological:     General: No focal deficit present.     Mental Status: He is alert and oriented to person, place, and time.  Psychiatric:        Mood and Affect: Mood normal.        Behavior: Behavior normal.        Thought Content: Thought content normal.        Judgment: Judgment normal.       Assessment & Plan:   1. Bilateral cold feet - No signs of vascular disease  - Toes were slightly cold but not abnormal.  - Possibly due to phentermine and vasoconstriction? Possibly due to nerves in low back but he has not radiculopathy symptoms. ? Cold intolerance - Advised watchful waiting.  - Can go back on phentermine next week   

## 2022-03-16 ENCOUNTER — Other Ambulatory Visit: Payer: Self-pay | Admitting: Adult Health

## 2022-03-16 ENCOUNTER — Encounter: Payer: Self-pay | Admitting: Adult Health

## 2022-03-16 DIAGNOSIS — E668 Other obesity: Secondary | ICD-10-CM

## 2022-03-16 NOTE — Telephone Encounter (Signed)
FYI

## 2022-03-17 ENCOUNTER — Other Ambulatory Visit: Payer: Self-pay | Admitting: Adult Health

## 2022-03-17 MED ORDER — PHENTERMINE HCL 30 MG PO CAPS: 30.0000 mg | ORAL_CAPSULE | ORAL | 0 refills | Status: DC

## 2022-03-31 DIAGNOSIS — L578 Other skin changes due to chronic exposure to nonionizing radiation: Secondary | ICD-10-CM | POA: Diagnosis not present

## 2022-03-31 DIAGNOSIS — L814 Other melanin hyperpigmentation: Secondary | ICD-10-CM | POA: Diagnosis not present

## 2022-03-31 DIAGNOSIS — D1801 Hemangioma of skin and subcutaneous tissue: Secondary | ICD-10-CM | POA: Diagnosis not present

## 2022-03-31 DIAGNOSIS — L821 Other seborrheic keratosis: Secondary | ICD-10-CM | POA: Diagnosis not present

## 2022-04-15 ENCOUNTER — Other Ambulatory Visit: Payer: Self-pay | Admitting: Adult Health

## 2022-04-15 MED ORDER — PHENTERMINE HCL 30 MG PO CAPS
30.0000 mg | ORAL_CAPSULE | ORAL | 2 refills | Status: DC
Start: 2022-04-15 — End: 2022-07-23

## 2022-05-05 ENCOUNTER — Encounter: Payer: Self-pay | Admitting: Adult Health

## 2022-05-05 ENCOUNTER — Ambulatory Visit (INDEPENDENT_AMBULATORY_CARE_PROVIDER_SITE_OTHER): Payer: BC Managed Care – PPO | Admitting: Adult Health

## 2022-05-05 VITALS — BP 100/70 | HR 79 | Temp 97.5°F | Ht 69.0 in | Wt 260.0 lb

## 2022-05-05 DIAGNOSIS — E668 Other obesity: Secondary | ICD-10-CM

## 2022-05-05 DIAGNOSIS — Z125 Encounter for screening for malignant neoplasm of prostate: Secondary | ICD-10-CM

## 2022-05-05 DIAGNOSIS — Z0000 Encounter for general adult medical examination without abnormal findings: Secondary | ICD-10-CM

## 2022-05-05 DIAGNOSIS — E785 Hyperlipidemia, unspecified: Secondary | ICD-10-CM

## 2022-05-05 DIAGNOSIS — E6689 Other obesity not elsewhere classified: Secondary | ICD-10-CM

## 2022-05-05 LAB — COMPREHENSIVE METABOLIC PANEL
ALT: 25 U/L (ref 0–53)
AST: 22 U/L (ref 0–37)
Albumin: 4.2 g/dL (ref 3.5–5.2)
Alkaline Phosphatase: 78 U/L (ref 39–117)
BUN: 12 mg/dL (ref 6–23)
CO2: 27 mEq/L (ref 19–32)
Calcium: 10.1 mg/dL (ref 8.4–10.5)
Chloride: 101 mEq/L (ref 96–112)
Creatinine, Ser: 1.02 mg/dL (ref 0.40–1.50)
GFR: 87.94 mL/min (ref >60.00)
Glucose, Bld: 102 mg/dL — ABNORMAL HIGH (ref 70–99)
Potassium: 4.5 mEq/L (ref 3.5–5.1)
Sodium: 137 mEq/L (ref 135–145)
Total Bilirubin: 0.5 mg/dL (ref 0.2–1.2)
Total Protein: 6.9 g/dL (ref 6.0–8.3)

## 2022-05-05 LAB — TSH: TSH: 2.95 u[IU]/mL (ref 0.35–5.50)

## 2022-05-05 LAB — CBC
HCT: 46.7 % (ref 39.0–52.0)
Hemoglobin: 15.7 g/dL (ref 13.0–17.0)
MCHC: 33.6 g/dL (ref 30.0–36.0)
MCV: 95.2 fl (ref 78.0–100.0)
Platelets: 214 10*3/uL (ref 150.0–400.0)
RBC: 4.91 Mil/uL (ref 4.22–5.81)
RDW: 13.6 % (ref 11.5–15.5)
WBC: 6.4 10*3/uL (ref 4.0–10.5)

## 2022-05-05 LAB — LIPID PANEL
Cholesterol: 216 mg/dL — ABNORMAL HIGH (ref 0–200)
HDL: 53.5 mg/dL (ref >39.00)
LDL Cholesterol: 138 mg/dL — ABNORMAL HIGH (ref 0–99)
NonHDL: 162.01
Total CHOL/HDL Ratio: 4
Triglycerides: 120 mg/dL (ref 0.0–149.0)
VLDL: 24 mg/dL (ref 0.0–40.0)

## 2022-05-05 LAB — PSA: PSA: 1.34 ng/mL (ref 0.10–4.00)

## 2022-05-05 NOTE — Progress Notes (Signed)
Subjective:    Patient ID: Jorge Walters, male    DOB: 18-Sep-1975, 47 y.o.   MRN: [id number]  HPI Patient presents for yearly preventative medicine examination. He is a pleasant 47 year old male who  has a past medical history of Allergy, Chicken pox, and Obesity.  Obesity - currently maintained on Phentermine 30 mg daily. He is tolerating this medication well with no side effects.  He has lost about 21 pounds. He continues to eat healthy and exercise on a routine basis. He has hit a plateau.  He would like to stop the medication for about a month and half to see what happens.   Wt Readings from Last 3 Encounters:  05/05/22 260 lb (117.9 kg)  01/20/22 259 lb (117.5 kg)  01/07/22 260 lb (117.9 kg)   Hyperlipidemia - mildly elevated LDL in the past.   All immunizations and health maintenance protocols were reviewed with the patient and needed orders were placed.  Appropriate screening laboratory values were ordered for the patient including screening of hyperlipidemia, renal function and hepatic function. If indicated by BPH, a PSA was ordered.  Medication reconciliation,  past medical history, social history, problem list and allergies were reviewed in detail with the patient  Goals were established with regard to weight loss, exercise, and  diet in compliance with medications  He is up to date on routine colon cancer screening.   Review of Systems  Constitutional: Negative.   HENT: Negative.    Eyes: Negative.   Respiratory: Negative.    Cardiovascular: Negative.   Gastrointestinal: Negative.   Endocrine: Negative.   Genitourinary: Negative.   Musculoskeletal: Negative.   Skin: Negative.   Allergic/Immunologic: Negative.   Neurological: Negative.   Hematological: Negative.   Psychiatric/Behavioral: Negative.    All other systems reviewed and are negative.  Past Medical History:  Diagnosis Date   Allergy    Chicken pox    Obesity     Social History    Socioeconomic History   Marital status: Married    Spouse name: Not on file   Number of children: Not on file   Years of education: Not on file   Highest education level: Bachelor's degree (e.g., BA, AB, BS)  Occupational History   Not on file  Tobacco Use   Smoking status: Never   Smokeless tobacco: Never  Substance and Sexual Activity   Alcohol use: Yes    Alcohol/week: 2.0 standard drinks of alcohol    Types: 1 Glasses of wine, 1 Cans of beer per week    Comment: 'social"   Drug use: No   Sexual activity: Not on file  Other Topics Concern   Not on file  Social History Narrative   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    Married    No children       Social Determinants of Health   Financial Resource Strain: Low Risk  (10/07/2021)   Overall Financial Resource Strain (CARDIA)    Difficulty of Paying Living Expenses: Not hard at all  Food Insecurity: No Food Insecurity (10/07/2021)   Hunger Vital Sign    Worried About Running Out of Food in the Last Year: Never true    Ran Out of Food in the Last Year: Never true  Transportation Needs: No Transportation Needs (10/07/2021)   PRAPARE - Hydrologist (Medical): No    Lack of Transportation (Non-Medical): No  Physical Activity: Sufficiently Active (10/07/2021)  Exercise Vital Sign    Days of Exercise per Week: 5 days    Minutes of Exercise per Session: 60 min  Stress: Stress Concern Present (10/07/2021)   [place]    Feeling of Stress : To some extent  Social Connections: Moderately Isolated (10/07/2021)   Social Connection and Isolation Panel [NHANES]    Frequency of Communication with Friends and Family: More than three times a week    Frequency of Social Gatherings with Friends and Family: Twice a week    Attends Religious Services: Never    Marine scientist or Organizations: No    Attends Arts development officer: Not on file    Marital Status: Married  Human resources officer Violence: Not on file    History reviewed. No pertinent surgical history.  Family History  Problem Relation Age of Onset   Stroke Father    Diabetes Father    Esophageal cancer Maternal Uncle    Heart attack Maternal Grandfather    Stomach cancer Neg Hx    Rectal cancer Neg Hx    Colon cancer Neg Hx    Colon polyps Neg Hx     No Known Allergies  Current Outpatient Medications on File Prior to Visit  Medication Sig Dispense Refill   [employer] SUNSCREEN SPF 30 EX apply topically to face and body daily for 30     GLUCOSAMINE-CHONDROITIN PO Take by mouth.     Multiple Vitamin (MULTIVITAMIN) capsule Take 2 capsules by mouth daily.     Omega-3 Fatty Acids (FISH OIL) 1000 MG CAPS Take by mouth.     OVER THE COUNTER MEDICATION Syllium husk     phentermine 30 MG capsule Take 1 capsule (30 mg total) by mouth every morning. 30 capsule 2   Current Facility-Administered Medications on File Prior to Visit  Medication Dose Route Frequency Provider Last Rate Last Admin   0.9 %  sodium chloride infusion  500 mL Intravenous Once Mansouraty, Telford Nab., MD        BP 100/70   Pulse 79   Temp (!) 97.5 F (36.4 C) (Oral)   Ht 5\' 9"  (1.753 m)   Wt 260 lb (117.9 kg)   SpO2 99%   BMI 38.40 kg/m       Objective:   Physical Exam Vitals and nursing note reviewed.  Constitutional:      General: He is not in acute distress.    Appearance: Normal appearance. He is obese. He is not ill-appearing.  HENT:     Head: Normocephalic and atraumatic.     Right Ear: Tympanic membrane, ear canal and external ear normal. There is no impacted cerumen.     Left Ear: Tympanic membrane, ear canal and external ear normal. There is no impacted cerumen.     Nose: Nose normal. No congestion or rhinorrhea.     Mouth/Throat:     Mouth: Mucous membranes are moist.     Pharynx: Oropharynx is clear.  Eyes:     Extraocular Movements:  Extraocular movements intact.     Conjunctiva/sclera: Conjunctivae normal.     Pupils: Pupils are equal, round, and reactive to light.  Neck:     Vascular: No carotid bruit.  Cardiovascular:     Rate and Rhythm: Normal rate and regular rhythm.     Pulses: Normal pulses.     Heart sounds: No murmur heard.    No friction rub. No gallop.  Pulmonary:  Effort: Pulmonary effort is normal.     Breath sounds: Normal breath sounds.  Abdominal:     General: Abdomen is flat. Bowel sounds are normal. There is no distension.     Palpations: Abdomen is soft. There is no mass.     Tenderness: There is no abdominal tenderness. There is no guarding or rebound.     Hernia: No hernia is present.  Musculoskeletal:        General: Normal range of motion.     Cervical back: Normal range of motion and neck supple.  Lymphadenopathy:     Cervical: No cervical adenopathy.  Skin:    General: Skin is warm and dry.     Capillary Refill: Capillary refill takes less than 2 seconds.  Neurological:     General: No focal deficit present.     Mental Status: He is alert and oriented to person, place, and time.  Psychiatric:        Mood and Affect: Mood normal.        Behavior: Behavior normal.        Thought Content: Thought content normal.        Judgment: Judgment normal.       Assessment & Plan:   1. Routine general medical examination at a health care facility Today patient counseled on age appropriate routine health concerns for screening and prevention, each reviewed and up to date or declined. Immunizations reviewed and up to date or declined. Labs ordered and reviewed. Risk factors for depression reviewed and negative. Hearing function and visual acuity are intact. ADLs screened and addressed as needed. Functional ability and level of safety reviewed and appropriate. Education, counseling and referrals performed based on assessed risks today. Patient provided with a copy of personalized plan for  preventive services. - Follow up in one year or sooner if needed  2. Other obesity Ok with stopping Phentermine for 1-2 months to see how he does off it. He will follow up  - Continue to exercise and eat healthy  - Lipid panel; Future - TSH; Future - CBC; Future - Comprehensive metabolic panel; Future  3. Prostate cancer screening  - PSA; Future  4. Hyperlipidemia, unspecified hyperlipidemia type - Consider adding statin  - Lipid panel; Future - TSH; Future - CBC; Future - Comprehensive metabolic panel; Future  Dorothyann Peng, NP

## 2022-05-05 NOTE — Patient Instructions (Addendum)
It was great seeing you today   We will follow up with you regarding your lab work   Please let me know if you need anything   Follow up in 45 days or so for weight loss visit

## 2022-05-14 ENCOUNTER — Other Ambulatory Visit: Payer: Self-pay | Admitting: Adult Health

## 2022-07-23 ENCOUNTER — Telehealth (INDEPENDENT_AMBULATORY_CARE_PROVIDER_SITE_OTHER): Payer: BC Managed Care – PPO | Admitting: Adult Health

## 2022-07-23 ENCOUNTER — Encounter: Payer: Self-pay | Admitting: Adult Health

## 2022-07-23 VITALS — HR 79 | Ht 69.0 in | Wt 253.0 lb

## 2022-07-23 DIAGNOSIS — E668 Other obesity: Secondary | ICD-10-CM | POA: Diagnosis not present

## 2022-07-23 MED ORDER — PHENTERMINE HCL 30 MG PO CAPS: 30.0000 mg | ORAL_CAPSULE | ORAL | 2 refills | Status: DC

## 2022-07-23 NOTE — Progress Notes (Signed)
Virtual Visit via Video Note  I connected with Tera Helper  on 07/23/22 at  7:00 AM EDT by a video enabled telemedicine application and verified that I am speaking with the correct person using two identifiers.  Location patient: home Location provider:work or home office Persons participating in the virtual visit: patient, provider  I discussed the limitations of evaluation and management by telemedicine and the availability of in person appointments. The patient expressed understanding and agreed to proceed.   HPI: He has been evaluated today for weight loss management.  He was last seen in March 2024 at which time he was maintained on phentermine 30 mg daily.  He was tolerating this medication well and has been able to lose about 21 pounds but had hit a plateau.  We decided to have him stop the medication for about a month and then restart and see if he would continue to lose weight.  Today he reports that he he stopped the medication for a few days and noticed that he was gaining weight so he restarted it. He is currently down another 7 pounds for a total of 28 pounds and is tolerating the medication well.  He is eating healthy and exercising routinely.   Wt Readings from Last 3 Encounters:  07/23/22 253 lb (114.8 kg)  05/05/22 260 lb (117.9 kg)  01/20/22 259 lb (117.5 kg)   ROS: See pertinent positives and negatives per HPI.  Past Medical History:  Diagnosis Date   Allergy    Chicken pox    Obesity     No past surgical history on file.  Family History  Problem Relation Age of Onset   Stroke Father    Diabetes Father    Esophageal cancer Maternal Uncle    Heart attack Maternal Grandfather    Stomach cancer Neg Hx    Rectal cancer Neg Hx    Colon cancer Neg Hx    Colon polyps Neg Hx        Current Outpatient Medications:    [employer]S SUNSCREEN SPF 30 EX, apply topically to face and body daily for 30, Disp: , Rfl:    GLUCOSAMINE-CHONDROITIN PO, Take by mouth., Disp: ,  Rfl:    Multiple Vitamin (MULTIVITAMIN) capsule, Take 2 capsules by mouth daily., Disp: , Rfl:    Omega-3 Fatty Acids (FISH OIL) 1000 MG CAPS, Take by mouth., Disp: , Rfl:    OVER THE COUNTER MEDICATION, Syllium husk, Disp: , Rfl:    phentermine 30 MG capsule, Take 1 capsule (30 mg total) by mouth every morning., Disp: 30 capsule, Rfl: 2  EXAM:  VITALS per patient if applicable:  GENERAL: alert, oriented, appears well and in no acute distress  HEENT: atraumatic, conjunttiva clear, no obvious abnormalities on inspection of external nose and ears  NECK: normal movements of the head and neck  LUNGS: on inspection no signs of respiratory distress, breathing rate appears normal, no obvious gross SOB, gasping or wheezing  CV: no obvious cyanosis  MS: moves all visible extremities without noticeable abnormality  PSYCH/NEURO: pleasant and cooperative, no obvious depression or anxiety, speech and thought processing grossly intact  ASSESSMENT AND PLAN:  Discussed the following assessment and plan:  1. Other obesity - Will keep him on Phentermine 30 mg daily.  - Follow up in 3 months  - phentermine 30 MG capsule; Take 1 capsule (30 mg total) by mouth every morning.  Dispense: 30 capsule; Refill: 2    I discussed the assessment and treatment plan  with the patient. The patient was provided an opportunity to ask questions and all were answered. The patient agreed with the plan and demonstrated an understanding of the instructions.   The patient was advised to call back or seek an in-person evaluation if the symptoms worsen or if the condition fails to improve as anticipated.   Shirline Frees, NP

## 2022-07-27 ENCOUNTER — Encounter: Payer: Self-pay | Admitting: Adult Health

## 2022-07-30 ENCOUNTER — Encounter: Payer: Self-pay | Admitting: Adult Health

## 2022-07-31 NOTE — Telephone Encounter (Signed)
FYI

## 2022-10-14 ENCOUNTER — Ambulatory Visit (INDEPENDENT_AMBULATORY_CARE_PROVIDER_SITE_OTHER): Payer: BC Managed Care – PPO | Admitting: Adult Health

## 2022-10-14 ENCOUNTER — Encounter: Payer: Self-pay | Admitting: Adult Health

## 2022-10-14 VITALS — BP 110/80 | HR 82 | Temp 98.1°F | Ht 69.0 in | Wt 240.0 lb

## 2022-10-14 DIAGNOSIS — E668 Other obesity: Secondary | ICD-10-CM

## 2022-10-14 DIAGNOSIS — Z6835 Body mass index (BMI) 35.0-35.9, adult: Secondary | ICD-10-CM

## 2022-10-14 DIAGNOSIS — I872 Venous insufficiency (chronic) (peripheral): Secondary | ICD-10-CM | POA: Diagnosis not present

## 2022-10-14 MED ORDER — PHENTERMINE HCL 30 MG PO CAPS: 30.0000 mg | ORAL_CAPSULE | ORAL | 2 refills | Status: DC

## 2022-10-14 NOTE — Progress Notes (Signed)
Subjective:    Patient ID: Jorge Walters, male    DOB: [birth year], 47 y.o.   MRN: [id number]  HPI 47 year old male who  has a past medical history of Allergy, Chicken pox, and Obesity.  He presents to the office today for weight loss management   Obesity - currently maintained on Phentermine 30 mg daily. He is tolerating this medication well with no side effects.  He has lost about 40 pounds. He continues to eat healthy and has been running and doing yoga.    Wt Readings from Last 10 Encounters:  10/14/22 240 lb (108.9 kg)  07/23/22 253 lb (114.8 kg)  05/05/22 260 lb (117.9 kg)  01/20/22 259 lb (117.5 kg)  01/07/22 260 lb (117.9 kg)  10/10/21 262 lb (118.8 kg)  07/15/21 272 lb (123.4 kg)  07/04/21 272 lb (123.4 kg)  04/24/21 281 lb (127.5 kg)  08/27/20 275 lb (124.7 kg)   He also wants a referral to [facility] for a second opinion. He was seen at  [facility] and diagnosed with mild CVI. He was going to have a vein ablation but the cost was pretty high.   Review of Systems  See HPI    Past Medical History:  Diagnosis Date   Allergy    Chicken pox    Obesity     Social History   Socioeconomic History   Marital status: Married    Spouse name: Not on file   Number of children: Not on file   Years of education: Not on file   Highest education level: Bachelor's degree (e.g., BA, AB, BS)  Occupational History   Not on file  Tobacco Use   Smoking status: Never   Smokeless tobacco: Never  Substance and Sexual Activity   Alcohol use: Yes    Alcohol/week: 2.0 standard drinks of alcohol    Types: 1 Glasses of wine, 1 Cans of beer per week    Comment: 'social"   Drug use: No   Sexual activity: Not on file  Other Topics Concern   Not on file  Social History Narrative   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    Married    No children       Social Determinants of Health   Financial Resource Strain: Low Risk   (10/10/2022)   Overall Financial Resource Strain (CARDIA)    Difficulty of Paying Living Expenses: Not hard at all  Food Insecurity: No Food Insecurity (10/10/2022)   Hunger Vital Sign    Worried About Running Out of Food in the Last Year: Never true    Ran Out of Food in the Last Year: Never true  Transportation Needs: No Transportation Needs (10/10/2022)   PRAPARE - Administrator, Civil Service (Medical): No    Lack of Transportation (Non-Medical): No  Physical Activity: Sufficiently Active (10/10/2022)   Exercise Vital Sign    Days of Exercise per Week: 5 days    Minutes of Exercise per Session: 90 min  Stress: No Stress Concern Present (10/10/2022)   [employer] of Occupational Health - Occupational Stress Questionnaire    Feeling of Stress : Not at all  Social Connections: Moderately Isolated (10/10/2022)   Social Connection and Isolation Panel [NHANES]    Frequency of Communication with Friends and Family: More than three times a week    Frequency of Social Gatherings with Friends and Family: Twice a week  Attends Religious Services: Never    Active Member of Clubs or Organizations: No    Attends Engineer, structural: Not on file    Marital Status: Married  Catering manager Violence: Not on file    History reviewed. No pertinent surgical history.  Family History  Problem Relation Age of Onset   Stroke Father    Diabetes Father    Esophageal cancer Maternal Uncle    Heart attack Maternal Grandfather    Stomach cancer Neg Hx    Rectal cancer Neg Hx    Colon cancer Neg Hx    Colon polyps Neg Hx     No Known Allergies  Current Outpatient Medications on File Prior to Visit  Medication Sig Dispense Refill   [employer] SUNSCREEN SPF 30 EX apply topically to face and body daily for 30     finasteride (PROPECIA) 1 MG tablet      GLUCOSAMINE-CHONDROITIN PO Take by mouth.     Minoxidil 5 % FOAM      Multiple Vitamin (MULTIVITAMIN) capsule Take 2 capsules  by mouth daily.     Omega-3 Fatty Acids (FISH OIL) 1000 MG CAPS Take by mouth.     OVER THE COUNTER MEDICATION Syllium husk     phentermine 30 MG capsule Take 1 capsule (30 mg total) by mouth every morning. 30 capsule 2   No current facility-administered medications on file prior to visit.    BP 110/80   Pulse 82   Temp 98.1 F (36.7 C) (Oral)   Ht 5\' 9"  (1.753 m)   Wt 240 lb (108.9 kg)   SpO2 95%   BMI 35.44 kg/m       Objective:   Physical Exam Vitals and nursing note reviewed.  Constitutional:      Appearance: Normal appearance. He is obese.  Cardiovascular:     Rate and Rhythm: Normal rate and regular rhythm.     Pulses: Normal pulses.     Heart sounds: Normal heart sounds.  Pulmonary:     Effort: Pulmonary effort is normal.     Breath sounds: Normal breath sounds.  Musculoskeletal:     Right lower leg: No edema.     Left lower leg: No edema.  Skin:    General: Skin is warm and dry.  Neurological:     General: No focal deficit present.     Mental Status: He is alert and oriented to person, place, and time.  Psychiatric:        Mood and Affect: Mood normal.        Behavior: Behavior normal.        Thought Content: Thought content normal.        Judgment: Judgment normal.        Assessment & Plan:  1. Other obesity - He is doing great! Keep up with healthy eating and exercise - Follow up in 3 months or sooner if needed - phentermine 30 MG capsule; Take 1 capsule (30 mg total) by mouth every morning.  Dispense: 30 capsule; Refill: 2  2. Venous insufficiency  - Ambulatory referral to Vascular Surgery  Shirline Frees, NP  Time spent with patient today was 32 minutes which consisted of chart review, discussing obesity, weight loss management and CVI,  work up, treatment answering questions and documentation.

## 2022-10-20 ENCOUNTER — Other Ambulatory Visit: Payer: Self-pay | Admitting: *Deleted

## 2022-10-20 DIAGNOSIS — I872 Venous insufficiency (chronic) (peripheral): Secondary | ICD-10-CM

## 2022-10-26 ENCOUNTER — Ambulatory Visit (HOSPITAL_COMMUNITY)
Admission: RE | Admit: 2022-10-26 | Discharge: 2022-10-26 | Disposition: A | Discharge status: Home / Self Care | Payer: BC Managed Care – PPO | Source: Ambulatory Visit | Attending: Surgery | Admitting: Surgery

## 2022-10-26 DIAGNOSIS — I872 Venous insufficiency (chronic) (peripheral): Secondary | ICD-10-CM | POA: Insufficient documentation

## 2022-11-16 ENCOUNTER — Ambulatory Visit (INDEPENDENT_AMBULATORY_CARE_PROVIDER_SITE_OTHER): Payer: BC Managed Care – PPO | Admitting: Physician Assistant

## 2022-11-16 ENCOUNTER — Encounter: Payer: Self-pay | Admitting: Physician Assistant

## 2022-11-16 VITALS — BP 108/71 | HR 73 | Temp 97.6°F | Resp 18 | Ht 69.0 in | Wt 235.7 lb

## 2022-11-16 DIAGNOSIS — I8312 Varicose veins of left lower extremity with inflammation: Secondary | ICD-10-CM

## 2022-11-16 DIAGNOSIS — I8311 Varicose veins of right lower extremity with inflammation: Secondary | ICD-10-CM

## 2022-11-16 DIAGNOSIS — I8393 Asymptomatic varicose veins of bilateral lower extremities: Secondary | ICD-10-CM

## 2022-11-16 DIAGNOSIS — I872 Venous insufficiency (chronic) (peripheral): Secondary | ICD-10-CM

## 2022-11-16 NOTE — Progress Notes (Signed)
Requested by:  Shirline Frees, NP [address] [place],  [place] [address]  Reason for consultation: venous insufficiency, superficial veins    History of Present Illness   Jorge Walters is a 47 y.o. (1976-02-13) male who presents for evaluation of bilateral lower extremity varicose veins. He is here for a second opinion. He explains that his varicose veins have become more prominent over the past 8 months. He reports about a 50 lb weight loss during this time. He was evaluated at the [facility] and was told that he was a candidate for lazer ablation. He has been wearing thigh high 20-30 mmHg compression stockings since June. He does report heaviness in his legs. He can tell a difference when not wearing his compression stockings. He does elevate intermittently. He exercises regularly.  He has no history of DVT. No family history of venous disease.  Venous symptoms include: heavy Onset/duration:  8 months   Aggravating factors: prolonged sitting, standing Alleviating factors: elevation, compression  Compression:  yes Helps:  yes Pain medications:  no Previous vein procedures:  no History of DVT:  no  Past Medical History:  Diagnosis Date   Allergy    Chicken pox    Obesity     No past surgical history on file.  Social History   Socioeconomic History   Marital status: Married    Spouse name: Not on file   Number of children: Not on file   Years of education: Not on file   Highest education level: Bachelor's degree (e.g., BA, AB, BS)  Occupational History   Not on file  Tobacco Use   Smoking status: Never   Smokeless tobacco: Never  Substance and Sexual Activity   Alcohol use: Yes    Alcohol/week: 2.0 standard drinks of alcohol    Types: 1 Glasses of wine, 1 Cans of beer per week    Comment: 'social"   Drug use: No   Sexual activity: Not on file  Other Topics Concern   Not on file  Social History Narrative   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    Married    No children       Social Determinants of Health   Financial Resource Strain: Low Risk  (10/10/2022)   Overall Financial Resource Strain (CARDIA)    Difficulty of Paying Living Expenses: Not hard at all  Food Insecurity: No Food Insecurity (10/10/2022)   Hunger Vital Sign    Worried About Running Out of Food in the Last Year: Never true    Ran Out of Food in the Last Year: Never true  Transportation Needs: No Transportation Needs (10/10/2022)   PRAPARE - Administrator, Civil Service (Medical): No    Lack of Transportation (Non-Medical): No  Physical Activity: Sufficiently Active (10/10/2022)   Exercise Vital Sign    Days of Exercise per Week: 5 days    Minutes of Exercise per Session: 90 min  Stress: No Stress Concern Present (10/10/2022)   [employer] of Occupational Health - Occupational Stress Questionnaire    Feeling of Stress : Not at all  Social Connections: Moderately Isolated (10/10/2022)   Social Connection and Isolation Panel [NHANES]    Frequency of Communication with Friends and Family: More than three times a week    Frequency of Social Gatherings with Friends and Family: Twice a week    Attends Religious Services: Never    Production manager of [employer]  or Organizations: No    Attends Engineer, structural: Not on file    Marital Status: Married  Catering manager Violence: Not on file    Family History  Problem Relation Age of Onset   Stroke Father    Diabetes Father    Esophageal cancer Maternal Uncle    Heart attack Maternal Grandfather    Stomach cancer Neg Hx    Rectal cancer Neg Hx    Colon cancer Neg Hx    Colon polyps Neg Hx     Current Outpatient Medications  Medication Sig Dispense Refill   [employer]S SUNSCREEN SPF 30 EX apply topically to face and body daily for 30     finasteride (PROPECIA) 1 MG tablet      GLUCOSAMINE-CHONDROITIN PO Take by mouth.     Minoxidil 5 % FOAM       Multiple Vitamin (MULTIVITAMIN) capsule Take 2 capsules by mouth daily.     Omega-3 Fatty Acids (FISH OIL) 1000 MG CAPS Take by mouth.     OVER THE COUNTER MEDICATION Syllium husk     phentermine 30 MG capsule Take 1 capsule (30 mg total) by mouth every morning. 30 capsule 2   No current facility-administered medications for this visit.    No Known Allergies  REVIEW OF SYSTEMS (negative unless checked):   Cardiac:  []  Chest pain or chest pressure? []  Shortness of breath upon activity? []  Shortness of breath when lying flat? []  Irregular heart rhythm?  Vascular:  []  Pain in calf, thigh, or hip brought on by walking? []  Pain in feet at night that wakes you up from your sleep? []  Blood clot in your veins? [x]  Leg swelling?  Pulmonary:  []  Oxygen at home? []  Productive cough? []  Wheezing?  Neurologic:  []  Sudden weakness in arms or legs? []  Sudden numbness in arms or legs? []  Sudden onset of difficult speaking or slurred speech? []  Temporary loss of vision in one eye? []  Problems with dizziness?  Gastrointestinal:  []  Blood in stool? []  Vomited blood?  Genitourinary:  []  Burning when urinating? []  Blood in urine?  Psychiatric:  []  Major depression  Hematologic:  []  Bleeding problems? []  Problems with blood clotting?  Dermatologic:  []  Rashes or ulcers?  Constitutional:  []  Fever or chills?  Ear/Nose/Throat:  []  Change in hearing? []  Nose bleeds? []  Sore throat?  Musculoskeletal:  []  Back pain? []  Joint pain? []  Muscle pain?   Physical Examination     Vitals:   11/16/22 0838  BP: 108/71  Pulse: 73  Resp: 18  Temp: 97.6 F (36.4 C)  SpO2: 98%  Weight: 235 lb 11.2 oz (106.9 kg)  Height: 5\' 9"  (1.753 m)   Body mass index is 34.81 kg/m.  General:  WDWN in NAD; vital signs documented above Gait: Normal HENT: WNL, normocephalic Pulmonary: normal non-labored breathing , without wheezing Cardiac: regular HR Abdomen: soft Vascular  Exam/Pulses: Extremities: with varicose veins, with reticular veins, without edema, without stasis pigmentation, without lipodermatosclerosis, without ulcers Musculoskeletal: no muscle wasting or atrophy  Neurologic: A&O X 3;  No focal weakness or paresthesias are detected Psychiatric:  The pt has Normal affect.  Non-invasive Vascular Imaging   BLE Venous Insufficiency Duplex (10/26/22):  RLE:  No DVT and SVT GSV reflux SFJ and proximal calf GSV diameter 0.26-0.68 cm No SSV reflux   CFV, popliteal deep venous reflux AASV reflux proximal to distal thigh   Medical Decision Making   Jorge Walters is a 47  y.o. male who presents with: RLE chronic venous insufficiency with varicose veins. Duplex shows  no DVT or SVT. He does have reflux in deep veins. He also has incompetent GSV and AASV. No SSV reflux. His AASV on the right is very large and could be a candidate for ablation of this. His GSV has segment that is very small distally but then becomes larger. I discussed that this may be a problem for a lazer ablation however they could access a shorter treatment segment higher in the leg. We discussed the progressive nature of venous disease. Encouraged conservative therapy with his current symptoms. He would like to pursue further evaluation for lazer ablation. Based on the patient's history and examination, I recommend: daily elevation of 20-30 minutes above level of heart, daily compression stocking use, exercise, weight reduction, refraining from prolonged sitting or standing. Continue use of  20-30 mm thigh high compression stockings He does have some reticular veins that could be treated with sclerotherapy. Explained that this is cosmetic treatment. He would like to be evaluated for ablation first prior to any cosmetic treatment The patient will follow up in near future with one of the vascular surgeons for further evaluation for venous ablation. I will arrange duplex of LLE at that time  for further evaluation to see if his left leg is also a candidate for ablation.   Graceann Congress, PA-C [facility] Office: [phone]  11/16/2022, 8:42 AM  Clinic MD: Eudelia Bunch

## 2022-11-25 ENCOUNTER — Other Ambulatory Visit: Payer: Self-pay

## 2022-11-25 DIAGNOSIS — I872 Venous insufficiency (chronic) (peripheral): Secondary | ICD-10-CM

## 2023-01-08 ENCOUNTER — Ambulatory Visit (INDEPENDENT_AMBULATORY_CARE_PROVIDER_SITE_OTHER): Payer: BC Managed Care – PPO | Admitting: Adult Health

## 2023-01-08 VITALS — BP 120/80 | HR 74 | Temp 97.7°F | Ht 69.0 in | Wt 237.0 lb

## 2023-01-08 DIAGNOSIS — H65192 Other acute nonsuppurative otitis media, left ear: Secondary | ICD-10-CM

## 2023-01-08 DIAGNOSIS — E669 Obesity, unspecified: Secondary | ICD-10-CM | POA: Diagnosis not present

## 2023-01-08 DIAGNOSIS — Z6835 Body mass index (BMI) 35.0-35.9, adult: Secondary | ICD-10-CM | POA: Diagnosis not present

## 2023-01-08 MED ORDER — PHENTERMINE HCL 30 MG PO CAPS: 30.0000 mg | ORAL_CAPSULE | ORAL | 2 refills | Status: DC

## 2023-01-08 MED ORDER — AMOXICILLIN 500 MG PO TABS: 500.0000 mg | ORAL_TABLET | Freq: Two times a day (BID) | ORAL | 0 refills | Status: AC

## 2023-01-08 NOTE — Progress Notes (Signed)
Subjective:    Patient ID: Jorge Walters, male    DOB: 1975-07-09, 47 y.o.   MRN: [id number]  Sinusitis This is a new problem. The current episode started in the past 7 days. The problem has been waxing and waning since onset. There has been no fever. Associated symptoms include congestion, ear pain (left sided. Worsening) and sinus pressure (improving). Pertinent negatives include no chills, shortness of breath or sore throat. Treatments tried: mucinex. The treatment provided no relief.    He is also do for weight loss management visit. He is currently on Phentermine 30 mg daily and has been tolerating this medication well. He is exercising and eating healthy. He has lost about 43 pounds.   Wt Readings from Last 3 Encounters:  01/08/23 237 lb (107.5 kg)  11/16/22 235 lb 11.2 oz (106.9 kg)  10/14/22 240 lb (108.9 kg)    Review of Systems  Constitutional:  Negative for chills.  HENT:  Positive for congestion, ear pain (left sided. Worsening) and sinus pressure (improving). Negative for sore throat.   Respiratory:  Negative for shortness of breath.     Past Medical History:  Diagnosis Date   Allergy    Chicken pox    Obesity     Social History   Socioeconomic History   Marital status: Married    Spouse name: Not on file   Number of children: Not on file   Years of education: Not on file   Highest education level: Bachelor's degree (e.g., BA, AB, BS)  Occupational History   Not on file  Tobacco Use   Smoking status: Never   Smokeless tobacco: Never  Substance and Sexual Activity   Alcohol use: Yes    Alcohol/week: 2.0 standard drinks of alcohol    Types: 1 Glasses of wine, 1 Cans of beer per week    Comment: 'social"   Drug use: No   Sexual activity: Not on file  Other Topics Concern   Not on file  Social History Narrative   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    Married    No children       Social Determinants of Health    Financial Resource Strain: Low Risk  (01/08/2023)   Overall Financial Resource Strain (CARDIA)    Difficulty of Paying Living Expenses: Not hard at all  Food Insecurity: No Food Insecurity (01/08/2023)   Hunger Vital Sign    Worried About Running Out of Food in the Last Year: Never true    Ran Out of Food in the Last Year: Never true  Transportation Needs: No Transportation Needs (01/08/2023)   PRAPARE - Administrator, Civil Service (Medical): No    Lack of Transportation (Non-Medical): No  Physical Activity: Sufficiently Active (01/08/2023)   Exercise Vital Sign    Days of Exercise per Week: 5 days    Minutes of Exercise per Session: 90 min  Stress: No Stress Concern Present (01/08/2023)   [employer] of Occupational Health - Occupational Stress Questionnaire    Feeling of Stress : Not at all  Social Connections: Moderately Isolated (01/08/2023)   Social Connection and Isolation Panel [NHANES]    Frequency of Communication with Friends and Family: More than three times a week    Frequency of Social Gatherings with Friends and Family: Once a week    Attends Religious Services: Never    Database administrator or Organizations: No    Attends Club or  Organization Meetings: Not on file    Marital Status: Married  Intimate Partner Violence: Not on file    History reviewed. No pertinent surgical history.  Family History  Problem Relation Age of Onset   Stroke Father    Diabetes Father    Esophageal cancer Maternal Uncle    Heart attack Maternal Grandfather    Stomach cancer Neg Hx    Rectal cancer Neg Hx    Colon cancer Neg Hx    Colon polyps Neg Hx     No Known Allergies  Current Outpatient Medications on File Prior to Visit  Medication Sig Dispense Refill   CVS SUNSCREEN SPF 30 EX apply topically to face and body daily for 30     finasteride (PROPECIA) 1 MG tablet      GLUCOSAMINE-CHONDROITIN PO Take by mouth.     Minoxidil 5 % FOAM      Multiple  Vitamin (MULTIVITAMIN) capsule Take 2 capsules by mouth daily.     Omega-3 Fatty Acids (FISH OIL) 1000 MG CAPS Take by mouth.     OVER THE COUNTER MEDICATION Syllium husk     No current facility-administered medications on file prior to visit.    BP 120/80   Pulse 74   Temp 97.7 F (36.5 C) (Oral)   Ht 5\' 9"  (1.753 m)   Wt 237 lb (107.5 kg)   SpO2 96%   BMI 35.00 kg/m       Objective:   Physical Exam Vitals and nursing note reviewed.  Constitutional:      Appearance: Normal appearance. He is obese.  HENT:     Right Ear: No middle ear effusion. Tympanic membrane is not bulging.     Left Ear: Tympanic membrane is erythematous and bulging.     Nose: No congestion or rhinorrhea.     Right Turbinates: Not enlarged.     Left Turbinates: Not enlarged.     Right Sinus: No maxillary sinus tenderness or frontal sinus tenderness.     Left Sinus: No maxillary sinus tenderness or frontal sinus tenderness.  Cardiovascular:     Rate and Rhythm: Normal rate and regular rhythm.     Pulses: Normal pulses.     Heart sounds: Normal heart sounds.  Pulmonary:     Effort: Pulmonary effort is normal.     Breath sounds: Normal breath sounds.  Skin:    General: Skin is warm and dry.  Neurological:     General: No focal deficit present.     Mental Status: He is alert and oriented to person, place, and time.  Psychiatric:        Mood and Affect: Mood normal.        Behavior: Behavior normal.        Thought Content: Thought content normal.        Judgment: Judgment normal.        Assessment & Plan:  1. Acute mucoid otitis media of left ear - Symptoms consistent with otitis media. Will treat with Amoxicillin.  - Follow up if not improving in the next 2-3 days  - amoxicillin (AMOXIL) 500 MG tablet; Take 1 tablet (500 mg total) by mouth 2 (two) times daily for 10 days.  Dispense: 20 tablet; Refill: 0  2. Obesity (BMI 35.0-39.9 without comorbidity)  - phentermine 30 MG capsule; Take 1  capsule (30 mg total) by mouth every morning.  Dispense: 30 capsule; Refill: 2  Shirline Frees, NP

## 2023-01-18 ENCOUNTER — Telehealth: Payer: Self-pay | Admitting: Adult Health

## 2023-01-18 DIAGNOSIS — H65192 Other acute nonsuppurative otitis media, left ear: Secondary | ICD-10-CM

## 2023-01-18 NOTE — Telephone Encounter (Signed)
Patient would like a referral to see an ENT, patients states his left ear is still bothering him, patient states he has completed his Amoxicillin and he feels like water is trapped in his ear.

## 2023-01-19 NOTE — Telephone Encounter (Signed)
Patient notified of update  and verbalized understanding. 

## 2023-01-19 NOTE — Telephone Encounter (Signed)
Please advise 

## 2023-01-22 ENCOUNTER — Encounter (INDEPENDENT_AMBULATORY_CARE_PROVIDER_SITE_OTHER): Payer: Self-pay | Admitting: Otolaryngology

## 2023-02-22 ENCOUNTER — Ambulatory Visit (HOSPITAL_COMMUNITY)
Admission: RE | Admit: 2023-02-22 | Discharge: 2023-02-22 | Disposition: A | Discharge status: Home / Self Care | Payer: BC Managed Care – PPO | Source: Ambulatory Visit | Attending: Surgery | Admitting: Surgery

## 2023-02-22 ENCOUNTER — Ambulatory Visit (INDEPENDENT_AMBULATORY_CARE_PROVIDER_SITE_OTHER): Payer: BC Managed Care – PPO | Admitting: Surgery

## 2023-02-22 ENCOUNTER — Encounter: Payer: Self-pay | Admitting: Surgery

## 2023-02-22 ENCOUNTER — Ambulatory Visit: Payer: BC Managed Care – PPO | Admitting: Surgery

## 2023-02-22 VITALS — BP 111/75 | HR 73 | Temp 97.9°F | Resp 20 | Ht 69.0 in | Wt 233.0 lb

## 2023-02-22 DIAGNOSIS — I8311 Varicose veins of right lower extremity with inflammation: Secondary | ICD-10-CM | POA: Diagnosis not present

## 2023-02-22 DIAGNOSIS — I8312 Varicose veins of left lower extremity with inflammation: Secondary | ICD-10-CM

## 2023-02-22 DIAGNOSIS — I872 Venous insufficiency (chronic) (peripheral): Secondary | ICD-10-CM

## 2023-02-22 NOTE — Progress Notes (Signed)
 [facility]  Patient name: Jorge Walters MRN: [id number] DOB: [birth year] Sex: male   REASON FOR VISIT:    Follow-up  HISOTRY OF PRESENT ILLNESS:    Jorge Walters is a 48 y.o. male who returns today for further evaluation of bilateral lower extremity venous disease with varicosities.  He states that his veins become more prominent over the past year.  He reports a 50 pound weight loss.  He has been wearing 20-30 thigh-high compression stockings since June 2024.  He complains of heaviness in his legs that is improved with compression therapy.  He gets regular exercise.  He has no history of DVT or family history of venous disease.  He has never undergone intervention.   PAST MEDICAL HISTORY:   Past Medical History:  Diagnosis Date   Allergy    Chicken pox    Obesity      FAMILY HISTORY:   Family History  Problem Relation Age of Onset   Stroke Father    Diabetes Father    Esophageal cancer Maternal Uncle    Heart attack Maternal Grandfather    Stomach cancer Neg Hx    Rectal cancer Neg Hx    Colon cancer Neg Hx    Colon polyps Neg Hx     SOCIAL HISTORY:   Social History   Tobacco Use   Smoking status: Never   Smokeless tobacco: Never  Substance Use Topics   Alcohol use: Yes    Alcohol/week: 2.0 standard drinks of alcohol    Types: 1 Glasses of wine, 1 Cans of beer per week    Comment: 'social     ALLERGIES:   No Known Allergies   CURRENT MEDICATIONS:   Current Outpatient Medications  Medication Sig Dispense Refill   [employer]S SUNSCREEN SPF 30 EX apply topically to face and body daily for 30     finasteride  (PROPECIA ) 1 MG tablet      GLUCOSAMINE-CHONDROITIN PO Take by mouth.     Minoxidil 5 % FOAM      Multiple Vitamin (MULTIVITAMIN) capsule Take 2 capsules by mouth daily.     Omega-3 Fatty Acids (FISH OIL) 1000 MG CAPS Take by mouth.     OVER THE COUNTER MEDICATION Syllium  husk     phentermine  30 MG capsule Take 1 capsule (30 mg total) by mouth every morning. 30 capsule 2   No current facility-administered medications for this visit.    REVIEW OF SYSTEMS:   [X]  denotes positive finding, [ ]  denotes negative finding Cardiac  Comments:  Chest pain or chest pressure:    Shortness of breath upon exertion:    Short of breath when lying flat:    Irregular heart rhythm:        Vascular    Pain in calf, thigh, or hip brought on by ambulation:    Pain in feet at night that wakes you up from your sleep:     Blood clot in your veins:    Leg swelling:         Pulmonary    Oxygen at home:    Productive cough:     Wheezing:         Neurologic    Sudden weakness in arms or legs:     Sudden numbness in arms or legs:     Sudden onset of difficulty speaking or slurred speech:    Temporary loss of vision in one eye:     Problems  with dizziness:         Gastrointestinal    Blood in stool:     Vomited blood:         Genitourinary    Burning when urinating:     Blood in urine:        Psychiatric    Major depression:         Hematologic    Bleeding problems:    Problems with blood clotting too easily:        Skin    Rashes or ulcers:        Constitutional    Fever or chills:      PHYSICAL EXAM:   Vitals:   02/22/23 1354  BP: 111/75  Pulse: 73  Resp: 20  Temp: 97.9 F (36.6 C)  SpO2: 96%  Weight: 233 lb (105.7 kg)  Height: 5' 9 (1.753 m)    GENERAL: The patient is a well-nourished male, in no acute distress. The vital signs are documented above. CARDIAC: There is a regular rate and rhythm.  VASCULAR: SonoSite was used to evaluate the left saphenous vein which is markedly dilated.  There is also a large varicosity along the anterior lower leg PULMONARY: Non-labored respirations MUSCULOSKELETAL: There are no major deformities or cyanosis. NEUROLOGIC: No focal weakness or paresthesias are detected. SKIN: See photo below PSYCHIATRIC:  The patient has a normal affect.     STUDIES:   I have reviewed the following reflux study: +--------------+---------+------+-----------+------------+-------------+  LEFT         Reflux NoRefluxReflux TimeDiameter cmsComments                               Yes                                        +--------------+---------+------+-----------+------------+-------------+  CFV                    yes   >1 second                            +--------------+---------+------+-----------+------------+-------------+  FV mid                  yes   >1 second                            +--------------+---------+------+-----------+------------+-------------+  Popliteal              yes   >1 second                            +--------------+---------+------+-----------+------------+-------------+  GSV at SFJ              yes    >500 ms      1.10                   +--------------+---------+------+-----------+------------+-------------+  GSV prox thigh          yes    >500 ms      1.06                   +--------------+---------+------+-----------+------------+-------------+  GSV mid thigh  yes    >500 ms      0.83                   +--------------+---------+------+-----------+------------+-------------+  GSV dist thigh          yes    >500 ms      1.06                   +--------------+---------+------+-----------+------------+-------------+  GSV at knee             yes    >500 ms      0.83                   +--------------+---------+------+-----------+------------+-------------+  GSV prox calf           yes    >500 ms      0.68                   +--------------+---------+------+-----------+------------+-------------+  GSV mid calf  no                            0.40                   +--------------+---------+------+-----------+------------+-------------+  SSV Pop Fossa no                             0.39                   +--------------+---------+------+-----------+------------+-------------+  SSV prox calf no                            0.32                   +--------------+---------+------+-----------+------------+-------------+  SSV mid calf  no                            0.39                   +--------------+---------+------+-----------+------------+-------------+  AASV O                  yes    >500 ms      0.45                   +--------------+---------+------+-----------+------------+-------------+  AASV P                  yes    >500 ms      0.25                   +--------------+---------+------+-----------+------------+-------------+  AASV M        no                            0.24    out of fascia   MEDICAL ISSUES:   CEAP class II, left leg: The patient gets significant heaviness in his left leg with activity.  This is significantly improved with compression socks.  He is interested in proceeding with laser ablation of the left saphenous vein which shows marked dilation and significant reflux.  In addition I would recommend greater than 20 stabs to remove the large varicosity in the lower leg on the left.  We  will work on therapist, occupational we will get this approved  The patient also has some right leg symptoms but they are not as severe.  His ultrasound shows an accessory saphenous with significant reflux that may be amenable to intervention after he recovers from the left side    Malvina New, IV, MD, FACS [facility] [phone] Pager [phone]

## 2023-02-24 ENCOUNTER — Other Ambulatory Visit: Payer: Self-pay | Admitting: *Deleted

## 2023-02-24 DIAGNOSIS — I83812 Varicose veins of left lower extremities with pain: Secondary | ICD-10-CM

## 2023-03-15 ENCOUNTER — Telehealth (INDEPENDENT_AMBULATORY_CARE_PROVIDER_SITE_OTHER): Payer: Self-pay | Admitting: Otolaryngology

## 2023-03-15 NOTE — Telephone Encounter (Signed)
Reminder Call: Date: 03/16/2023 Status: Sch  Time: 1:30 PM [address] [place], [place] [address]  Confirmed time and location w/patient.

## 2023-03-16 ENCOUNTER — Encounter (INDEPENDENT_AMBULATORY_CARE_PROVIDER_SITE_OTHER): Payer: Self-pay

## 2023-03-16 ENCOUNTER — Ambulatory Visit (INDEPENDENT_AMBULATORY_CARE_PROVIDER_SITE_OTHER): Payer: BC Managed Care – PPO | Admitting: Audiology

## 2023-03-16 ENCOUNTER — Ambulatory Visit (INDEPENDENT_AMBULATORY_CARE_PROVIDER_SITE_OTHER): Payer: BC Managed Care – PPO | Admitting: Otolaryngology

## 2023-03-16 VITALS — BP 117/77 | HR 72 | Ht 69.0 in | Wt 228.0 lb

## 2023-03-16 DIAGNOSIS — H9042 Sensorineural hearing loss, unilateral, left ear, with unrestricted hearing on the contralateral side: Secondary | ICD-10-CM

## 2023-03-16 DIAGNOSIS — J310 Chronic rhinitis: Secondary | ICD-10-CM | POA: Diagnosis not present

## 2023-03-16 DIAGNOSIS — H6982 Other specified disorders of Eustachian tube, left ear: Secondary | ICD-10-CM

## 2023-03-16 DIAGNOSIS — H9012 Conductive hearing loss, unilateral, left ear, with unrestricted hearing on the contralateral side: Secondary | ICD-10-CM

## 2023-03-16 DIAGNOSIS — R0981 Nasal congestion: Secondary | ICD-10-CM

## 2023-03-16 NOTE — Progress Notes (Signed)
  [address], [address] [place], [place] [address] [phone]  Audiological Evaluation    Name: Jorge Walters     DOB:   10-Jun-1975      MRN:   [id number]                                                                                     Service Date: 03/16/2023     Accompanied by: unaccompanied   Patient comes today after Dr. Suszanne Conners, ENT sent a referral for a hearing evaluation due to concerns with hearing loss.   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 Left ear:  Clear external ear canals and notable landmarks visualized on the tympanic membrane.  Tympanometry: Right ear: Type A- Normal external ear canal volume with normal middle ear pressure and tympanic membrane compliance Left ear: Type A- Normal external ear canal volume with normal middle ear pressure and tympanic membrane compliance   Pure tone Audiometry: Right ear- Normal hearing from [phone] Hz.   Left ear-  Normal to severe sensorineural hearing loss from [phone] Hz  The hearing test results were completed under headphones and re-checked with inserts and results are deemed to be of good reliability. Test technique:  conventional     Speech Audiometry: Right ear- Speech Reception Threshold (SRT) was obtained at 5 dBHL Left ear-Speech Reception Threshold (SRT) was obtained at 25 dBHL   Word Recognition Score Tested using NU-6 (MLV) Right ear: 92% was obtained at a presentation level of 55 dBHL with contralateral masking which is deemed as   excellent Left ear: 92% was obtained at a presentation level of 70 dBHL with contralateral masking which is deemed as  excellent    Recommendations: Follow up with ENT as scheduled for today. Consider a communication needs assessment after medical clearance for hearing aids is obtained.   Zvi Duplantis MARIE LEROUX-MARTINEZ, AUD

## 2023-03-17 DIAGNOSIS — J310 Chronic rhinitis: Secondary | ICD-10-CM | POA: Insufficient documentation

## 2023-03-17 DIAGNOSIS — H6982 Other specified disorders of Eustachian tube, left ear: Secondary | ICD-10-CM | POA: Insufficient documentation

## 2023-03-17 DIAGNOSIS — H9042 Sensorineural hearing loss, unilateral, left ear, with unrestricted hearing on the contralateral side: Secondary | ICD-10-CM | POA: Insufficient documentation

## 2023-03-17 NOTE — Addendum Note (Signed)
Addended bySuszanne Conners, Leialoha Hanna on: 03/17/2023 11:06 AM   Modules accepted: Orders

## 2023-03-17 NOTE — Progress Notes (Signed)
Patient ID: Jorge Walters, male   DOB: 1975/06/07, 48 y.o.   MRN: [id number]  CC: Left ear infection, left ear hearing loss  HPI:  Jorge Walters is a 48 y.o. male who presents today complaining of left ear hearing loss and a recent left ear infection.  According to the patient, he has difficulty hearing on the left side for several years.  His left ear hearing loss has not been evaluated by an ENT physician.  He was also diagnosed with a left otitis media in November 2024.  He was having significant left ear pain.  He was treated with antibiotic.  Currently he denies any significant otalgia or otorrhea.  However, he has a clogging sensation in the left ear.  The patient previously underwent bilateral myringotomy and tube placement as a child.  Both tubes have since extruded.  He has never worn hearing aids.  He has a history of environmental allergies.  He uses Zyrtec and nasal saline irrigation regularly.  Past Medical History:  Diagnosis Date   Allergy    Chicken pox    Obesity     History reviewed. No pertinent surgical history.  Family History  Problem Relation Age of Onset   Stroke Father    Diabetes Father    Esophageal cancer Maternal Uncle    Heart attack Maternal Grandfather    Stomach cancer Neg Hx    Rectal cancer Neg Hx    Colon cancer Neg Hx    Colon polyps Neg Hx     Social History:  reports that he has never smoked. He has never used smokeless tobacco. He reports current alcohol use of about 2.0 standard drinks of alcohol per week. He reports that he does not use drugs.  Allergies: No Known Allergies  Prior to Admission medications   Medication Sig Start Date End Date Taking? Authorizing Provider  [employer] SUNSCREEN SPF 30 EX apply topically to face and body daily for 30   Yes [provider]  finasteride (PROPECIA) 1 MG tablet  09/17/22  Yes [provider]  GLUCOSAMINE-CHONDROITIN PO Take by mouth.   Yes [provider]   Minoxidil 5 % FOAM  09/17/22  Yes [provider]  Multiple Vitamin (MULTIVITAMIN) capsule Take 2 capsules by mouth daily.   Yes [provider]  Omega-3 Fatty Acids (FISH OIL) 1000 MG CAPS Take by mouth.   Yes [provider]  OVER THE COUNTER MEDICATION Syllium husk   Yes [provider]  phentermine 30 MG capsule Take 1 capsule (30 mg total) by mouth every morning. 01/08/23  Yes Nafziger, Kandee Keen, NP    Blood pressure 117/77, pulse 72, height 5\' 9"  (1.753 m), weight 228 lb (103.4 kg). Exam: General: Communicates without difficulty, well nourished, no acute distress. Head: Normocephalic, no evidence injury, no tenderness, facial buttresses intact without stepoff. Face/sinus: No tenderness to palpation and percussion. Facial movement is normal and symmetric. Eyes: PERRL, EOMI. No scleral icterus, conjunctivae clear. Neuro: CN II exam reveals vision grossly intact.  No nystagmus at any point of gaze. Ears: Auricles well formed without lesions.  Ear canals are intact without mass or lesion.  No erythema or edema is appreciated.  The TMs are intact without fluid. Nose: External evaluation reveals normal support and skin without lesions.  Dorsum is intact.  Anterior rhinoscopy reveals congested mucosa over anterior aspect of inferior turbinates and intact septum.  No purulence noted. Oral:  Oral cavity and oropharynx are intact, symmetric, without erythema  or edema.  Mucosa is moist without lesions. Neck: Full range of motion without pain.  There is no significant lymphadenopathy.  No masses palpable.  Thyroid bed within normal limits to palpation.  Parotid glands and submandibular glands equal bilaterally without mass.  Trachea is midline. Neuro:  CN 2-12 grossly intact.   His hearing test shows asymmetric left ear high-frequency sensorineural hearing loss.  Assessment: 1.  The patient's recent acute otitis media has resolved.  No middle ear effusion or infection is noted  today.  His tympanic membrane's and middle ear spaces are noted to be normal. 2.  His history is consistent with left ear eustachian tube dysfunction. 3.  Asymmetric left ear high-frequency sensorineural hearing loss. 4.  Chronic rhinitis with nasal mucosal congestion.  Plan: 1.  The physical exam findings and the hearing test results are reviewed with the patient. 2.  The patient is reassured and no acute infection is noted today. 3.  The small possibility of a retrocochlear lesion causing the asymmetric hearing loss is discussed.  The options of conservative observation versus MRI scan are reviewed.  The patient would like to proceed with MRI scan. 4.  The patient will return for reevaluation in 6 months, sooner if abnormality is noted on his MRI scan.  Jorge Walters W Jorge Walters 03/17/2023, 10:49 AM

## 2023-03-18 DIAGNOSIS — D229 Melanocytic nevi, unspecified: Secondary | ICD-10-CM | POA: Diagnosis not present

## 2023-03-18 DIAGNOSIS — L814 Other melanin hyperpigmentation: Secondary | ICD-10-CM | POA: Diagnosis not present

## 2023-03-18 DIAGNOSIS — L821 Other seborrheic keratosis: Secondary | ICD-10-CM | POA: Diagnosis not present

## 2023-03-18 DIAGNOSIS — L578 Other skin changes due to chronic exposure to nonionizing radiation: Secondary | ICD-10-CM | POA: Diagnosis not present

## 2023-04-02 ENCOUNTER — Ambulatory Visit
Admission: RE | Admit: 2023-04-02 | Discharge: 2023-04-02 | Disposition: A | Discharge status: Home / Self Care | Payer: BC Managed Care – PPO | Source: Ambulatory Visit | Attending: Otolaryngology | Admitting: Otolaryngology

## 2023-04-02 DIAGNOSIS — H903 Sensorineural hearing loss, bilateral: Secondary | ICD-10-CM | POA: Diagnosis not present

## 2023-04-02 MED ORDER — GADOPICLENOL 0.5 MMOL/ML IV SOLN
10.0000 mL | Freq: Once | INTRAVENOUS | Status: AC | PRN
Start: 2023-04-02 — End: 2023-04-02
  Administered 2023-04-02: 10 mL via INTRAVENOUS

## 2023-04-02 MED ORDER — GADOPICLENOL 0.5 MMOL/ML IV SOLN: 10.0000 mL | Freq: Once | INTRAVENOUS | Status: DC | PRN

## 2023-04-06 ENCOUNTER — Other Ambulatory Visit: Payer: Self-pay | Admitting: *Deleted

## 2023-04-06 MED ORDER — LORAZEPAM 1 MG PO TABS: ORAL_TABLET | ORAL | 0 refills | Status: DC

## 2023-04-15 ENCOUNTER — Ambulatory Visit: Payer: BC Managed Care – PPO | Admitting: Surgery

## 2023-04-15 ENCOUNTER — Encounter: Payer: Self-pay | Admitting: Surgery

## 2023-04-15 VITALS — BP 121/78 | HR 88 | Temp 97.6°F | Resp 16 | Ht 69.0 in | Wt 228.0 lb

## 2023-04-15 DIAGNOSIS — I83812 Varicose veins of left lower extremities with pain: Secondary | ICD-10-CM | POA: Diagnosis not present

## 2023-04-15 HISTORY — PX: LASER ABLATION: SHX1947

## 2023-04-15 NOTE — Progress Notes (Signed)
     Laser Ablation Procedure    Date: 04/15/2023   Jorge Walters DOB:02-Dec-1975  Consent signed: Yes      Surgeon: Jeanella Cara  Procedure: Laser Ablation: left Greater Saphenous Vein   Tumescent Anesthesia: 700 cc 0.9% NaCl with 50 cc Lidocaine HCL 1%  and 15 cc 8.4% NaHCO3  Local Anesthesia: 9 cc Lidocaine HCL and NaHCO3 (ratio 2:1)  7 watts continuous mode     Total energy: 2002.3 joules    Total time: 286s Treatment Length 39cm  Laser Fiber Ref. # [id number]     Lot # [id number]   Stab Phlebectomy: Greater than 20 Sites: Thigh and Calf  Patient tolerated procedure well  Notes:   All staff members wore facial masks and facial shields/goggles.  Patient took Ativan 1mg  by mouth at 6:30am.  Description of Procedure: After marking the course of the secondary varicosities, the patient was placed on the operating table in the supine position, and the left leg was prepped and draped in sterile fashion.   Local anesthetic was administered and under ultrasound guidance the saphenous vein was accessed with a micro needle and guide wire; then the mirco puncture sheath was placed.  A guide wire was inserted saphenofemoral junction , followed by a 5 french sheath.  The position of the sheath and then the laser fiber below the junction was confirmed using the ultrasound.  Tumescent anesthesia was administered along the course of the saphenous vein using ultrasound guidance. The patient was placed in Trendelenburg position and protective laser glasses were placed on patient and staff, and the laser was fired at 7 watts continuous mode for a total of 2002.3 joules. For stab phlebectomies, local anesthetic was administered at the previously marked varicosities, and tumescent anesthesia was administered around the vessels.  Greater than 20 stab wounds were made using the tip of an 11 blade. And using the vein hook, the phlebectomies were performed using a hemostat to avulse the  varicosities.  Adequate hemostasis was achieved.   Steri strips were applied to the stab wounds and ABD pads and thigh high compression stockings were applied.  Ace wrap bandages were applied over the phlebectomy sites and at the top of the saphenofemoral junction. Blood loss was less than 15 cc.  Discharge instructions reviewed with patient and hardcopy of discharge instructions given to patient to take home. The patient ambulated out of the operating room having tolerated the procedure well.

## 2023-04-19 ENCOUNTER — Encounter: Payer: Self-pay | Admitting: Adult Health

## 2023-04-20 NOTE — Telephone Encounter (Signed)
**Note De-identified  [place] Obfuscation** Please advise 

## 2023-04-23 ENCOUNTER — Telehealth: Admitting: Adult Health

## 2023-04-23 VITALS — Ht 69.0 in | Wt 234.0 lb

## 2023-04-23 DIAGNOSIS — I872 Venous insufficiency (chronic) (peripheral): Secondary | ICD-10-CM

## 2023-04-23 DIAGNOSIS — E669 Obesity, unspecified: Secondary | ICD-10-CM

## 2023-04-23 MED ORDER — PHENTERMINE HCL 30 MG PO CAPS
30.0000 mg | ORAL_CAPSULE | ORAL | 2 refills | Status: DC
Start: 2023-04-23 — End: 2023-07-23

## 2023-04-23 NOTE — Progress Notes (Signed)
 Virtual Visit via Video Note  I connected with Jorge Walters  on 04/23/23 at  3:30 PM EST by a video enabled telemedicine application and verified that I am speaking with the correct person using two identifiers.  Location Jorge Walters: home Location provider:work or home office Persons participating in the virtual visit: Jorge Walters, provider  I discussed the limitations of evaluation and management by telemedicine and the availability of in person appointments. The Jorge Walters expressed understanding and agreed to proceed.   HPI:  He is being evaluated today virtually for weight loss management.   Obesity - currently maintained on Phentermine 30 mg daily. He is tolerating this medication well with no side effects.  He reports that he was down to about 228 pounds but has been on bedrest for the last 4 to 5 days after a laser ablation of the left greater saphenous vein and varicose vein removal so he has not been able to exercise and his weight has increased to 234 lbs.  Once he is able to start exercising his weight will come down.  Wt Readings from Last 10 Encounters:  04/23/23 234 lb (106.1 kg)  04/15/23 228 lb (103.4 kg)  03/16/23 228 lb (103.4 kg)  02/22/23 233 lb (105.7 kg)  01/08/23 237 lb (107.5 kg)  11/16/22 235 lb 11.2 oz (106.9 kg)  10/14/22 240 lb (108.9 kg)  07/23/22 253 lb (114.8 kg)  05/05/22 260 lb (117.9 kg)  01/20/22 259 lb (117.5 kg)    ROS: See pertinent positives and negatives per HPI.  Past Medical History:  Diagnosis Date   Allergy    Chicken pox    Obesity     Past Surgical History:  Procedure Laterality Date   LASER ABLATION Left 04/15/2023   Left Greater saphenous vein with greater than 20 micros by Jeanella Cara    Family History  Problem Relation Age of Onset   Stroke Father    Diabetes Father    Esophageal cancer Maternal Uncle    Heart attack Maternal Grandfather    Stomach cancer Neg Hx    Rectal cancer Neg Hx    Colon cancer Neg Hx    Colon  polyps Neg Hx        Current Outpatient Medications:    [employer] SUNSCREEN SPF 30 EX, apply topically to face and body daily for 30, Disp: , Rfl:    finasteride (PROPECIA) 1 MG tablet, , Disp: , Rfl:    GLUCOSAMINE-CHONDROITIN PO, Take by mouth., Disp: , Rfl:    LORazepam (ATIVAN) 1 MG tablet, Take 1 tablet 30 to 60 minutes prior to leaving house on day of office surgery.  Bring second tablet with you to office on day of office surgery., Disp: 2 tablet, Rfl: 0   Minoxidil 5 % FOAM, , Disp: , Rfl:    Multiple Vitamin (MULTIVITAMIN) capsule, Take 2 capsules by mouth daily., Disp: , Rfl:    Omega-3 Fatty Acids (FISH OIL) 1000 MG CAPS, Take by mouth., Disp: , Rfl:    OVER THE COUNTER MEDICATION, Syllium husk, Disp: , Rfl:    phentermine 30 MG capsule, Take 1 capsule (30 mg total) by mouth every morning., Disp: 30 capsule, Rfl: 2  EXAM:  VITALS per Jorge Walters if applicable:  GENERAL: alert, oriented, appears well and in no acute distress  HEENT: atraumatic, conjunttiva clear, no obvious abnormalities on inspection of external nose and ears  NECK: normal movements of the head and neck  LUNGS: on inspection no signs of respiratory distress, breathing  rate appears normal, no obvious gross SOB, gasping or wheezing  CV: no obvious cyanosis  MS: moves all visible extremities without noticeable abnormality  PSYCH/NEURO: pleasant and cooperative, no obvious depression or anxiety, speech and thought processing grossly intact  ASSESSMENT AND PLAN:  Discussed the following assessment and plan:  1. Obesity (BMI 35.0-39.9 without comorbidity) - Will continue him on Phentermine 30 mg for the next three months  - phentermine 30 MG capsule; Take 1 capsule (30 mg total) by mouth every morning.  Dispense: 30 capsule; Refill: 2  2. Venous insufficiency (Primary) - Follow recommendations by Vein and vascular for increasing activity       I discussed the assessment and treatment plan with the Jorge Walters.  The Jorge Walters was provided an opportunity to ask questions and all were answered. The Jorge Walters agreed with the plan and demonstrated an understanding of the instructions.   The Jorge Walters was advised to call back or seek an in-person evaluation if the symptoms worsen or if the condition fails to improve as anticipated.   Shirline Frees, NP

## 2023-04-27 ENCOUNTER — Telehealth: Payer: Self-pay

## 2023-04-27 NOTE — Telephone Encounter (Signed)
 Returned pt's call regarding some discomfort on his upper thigh s/p laser ablation, where the top of the compression stocking hits. He has been advised to try to wear the stocking either higher or lower than this area. He also has an ACE wrap he will try to use, because he does not think the compression hose will go higher than bruise due to the size of his thigh. He is scheduled here for 2 week f/u on Thurs. He will call if this does not improve/worsens.

## 2023-04-29 ENCOUNTER — Ambulatory Visit (INDEPENDENT_AMBULATORY_CARE_PROVIDER_SITE_OTHER): Payer: BC Managed Care – PPO | Admitting: Surgery

## 2023-04-29 ENCOUNTER — Ambulatory Visit (HOSPITAL_COMMUNITY)
Admission: RE | Admit: 2023-04-29 | Discharge: 2023-04-29 | Disposition: A | Discharge status: Home / Self Care | Payer: BC Managed Care – PPO | Source: Ambulatory Visit | Attending: Vascular Surgery | Admitting: Vascular Surgery

## 2023-04-29 VITALS — BP 123/74 | HR 87 | Temp 97.7°F | Resp 18 | Wt 236.0 lb

## 2023-04-29 DIAGNOSIS — I83812 Varicose veins of left lower extremities with pain: Secondary | ICD-10-CM | POA: Insufficient documentation

## 2023-04-29 DIAGNOSIS — I83813 Varicose veins of bilateral lower extremities with pain: Secondary | ICD-10-CM | POA: Diagnosis not present

## 2023-04-29 DIAGNOSIS — I83811 Varicose veins of right lower extremities with pain: Secondary | ICD-10-CM

## 2023-04-29 NOTE — Progress Notes (Signed)
 Patient name: Jorge Walters MRN: [id number] DOB: [birth year] Sex: male  REASON FOR VISIT:     Post op  HISTORY OF PRESENT ILLNESS:   Jorge Walters is a 48 y.o. 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  Therefore on 04/15/2023 he underwent endovenous laser ablation of the left great saphenous vein with greater than 20 stabs.  He is recovering nicely.  He only has some soreness at his stab phlebectomy sites and a little bit in the left groin  He continues to have issues with the right leg.  He has heaviness and some discomfort particular at the end of the day.  He has been wearing 20-30 thigh-high compression stockings and performing regular exercise without significant benefit.  He also uses the lounge doctor to keep his legs elevated.  He is interested in addressing his right leg  CURRENT MEDICATIONS:    Current Outpatient Medications  Medication Sig Dispense Refill   [employer] SUNSCREEN SPF 30 EX apply topically to face and body daily for 30     finasteride (PROPECIA) 1 MG tablet      GLUCOSAMINE-CHONDROITIN PO Take by mouth.     Minoxidil 5 % FOAM      Multiple Vitamin (MULTIVITAMIN) capsule Take 2 capsules by mouth daily.     Omega-3 Fatty Acids (FISH OIL) 1000 MG CAPS Take by mouth.     OVER THE COUNTER MEDICATION Syllium husk     phentermine 30 MG capsule Take 1 capsule (30 mg total) by mouth every morning. 30 capsule 2   No current facility-administered medications for this visit.    REVIEW OF SYSTEMS:   [X]  denotes positive finding, [ ]  denotes negative finding Cardiac  Comments:  Chest pain or chest pressure:    Shortness of breath upon exertion:    Short of breath when lying flat:    Irregular heart rhythm:    Constitutional    Fever or chills:      PHYSICAL EXAM:   Vitals:   04/29/23 0939  BP: 123/74  Pulse: 87  Resp: 18  Temp: 97.7 F (36.5 C)  TempSrc: Temporal  SpO2:  96%  Weight: 236 lb (107 kg)    GENERAL: The patient is a well-nourished male, in no acute distress. The vital signs are documented above. CARDIOVASCULAR: There is a regular rate and rhythm. PULMONARY: Non-labored respirations All incision sites from the left leg intervention are healing appropriately SonoSite was used to evaluate the right leg.  He has a large anterior accessory vein that branches in the mid thigh.  There are several tributary varicosities going down onto the right lateral leg.  He also has a cluster of spider veins in the medial calf       STUDIES:   Duplex: Left:  - No evidence of deep vein thrombosis seen in the left lower extremity,  from the common femoral through the popliteal veins.  - Successful ablation of the greater saphenous vein with total occlusion  2.00cm from the SFJ and partial occlusion 0.839 cm from the SFJ.     Right: Venous Reflux Times  +-----------------+---------+------+-----------+------------+--------+  RIGHT           Reflux NoRefluxReflux TimeDiameter cmsComments                             Yes                                   +-----------------+---------+------+-----------+------------+--------+  CFV                       yes   >1 second                       +-----------------+---------+------+-----------+------------+--------+  FV prox          no                                              +-----------------+---------+------+-----------+------------+--------+  FV mid           no                                              +-----------------+---------+------+-----------+------------+--------+  FV dist          no                                              +-----------------+---------+------+-----------+------------+--------+  Popliteal                 yes   >1 second                       +-----------------+---------+------+-----------+------------+--------+  GSV at SFJ                  yes    >500 ms     0.839              +-----------------+---------+------+-----------+------------+--------+  GSV prox thigh   no                           0.679              +-----------------+---------+------+-----------+------------+--------+  GSV mid thigh    no                            0.26              +-----------------+---------+------+-----------+------------+--------+  GSV prox calf              yes    >500 ms     0.398              +-----------------+---------+------+-----------+------------+--------+  SSV Pop Fossa    no                           0.529              +-----------------+---------+------+-----------+------------+--------+  SSV prox calf    no                           0.315              +-----------------+---------+------+-----------+------------+--------+  SSV mid calf     no                           0.383              +-----------------+---------+------+-----------+------------+--------+  AASV prox thigh            yes    >500 ms     0.699              +-----------------+---------+------+-----------+------------+--------+  AASV mid thigh             yes    >500 ms      1.07              +-----------------+---------+------+-----------+------------+--------+  AASV distal thigh          yes    >500 ms              tortuous  +-----------------+---------+------+-----------+------------+--------+   MEDICAL ISSUES:   Left leg: The patient has undergone successful laser ablation and stab phlebectomy.  He is recovering nicely.  Ultrasound shows successful closure of the vein.  Right leg: He continues to have symptoms of heaviness and swelling in the right leg.  Duplex shows reflux in his anterior accessory saphenous vein which was markedly dilated.  I looked at this with the SonoSite and it is amenable to laser ablation from approximately the distal thigh up to the groin.  He has large  tributary varicosities coming off of this into the lateral calf and a cluster of spider veins on the medial calf.  I discussed endovenous laser ablation of the right anterior saphenous vein with greater than 20 stabs to treat his varicosities.  He will also likely need 2-3 vials of sclerotherapy to address the medial calf.  This would be done once he recovers from the laser ablation.  Charlena Cross, MD, FACS [facility] [phone] Pager [phone]

## 2023-05-05 ENCOUNTER — Other Ambulatory Visit: Payer: Self-pay | Admitting: *Deleted

## 2023-05-05 DIAGNOSIS — I83811 Varicose veins of right lower extremities with pain: Secondary | ICD-10-CM

## 2023-06-10 ENCOUNTER — Encounter: Payer: Self-pay | Admitting: Radiology

## 2023-07-23 ENCOUNTER — Encounter: Payer: Self-pay | Admitting: Adult Health

## 2023-07-23 ENCOUNTER — Ambulatory Visit (INDEPENDENT_AMBULATORY_CARE_PROVIDER_SITE_OTHER): Admitting: Adult Health

## 2023-07-23 VITALS — BP 100/78 | HR 68 | Temp 98.2°F | Ht 69.0 in | Wt 241.0 lb

## 2023-07-23 DIAGNOSIS — Z125 Encounter for screening for malignant neoplasm of prostate: Secondary | ICD-10-CM

## 2023-07-23 DIAGNOSIS — E785 Hyperlipidemia, unspecified: Secondary | ICD-10-CM | POA: Diagnosis not present

## 2023-07-23 DIAGNOSIS — Z6835 Body mass index (BMI) 35.0-35.9, adult: Secondary | ICD-10-CM

## 2023-07-23 DIAGNOSIS — E669 Obesity, unspecified: Secondary | ICD-10-CM

## 2023-07-23 DIAGNOSIS — Z0000 Encounter for general adult medical examination without abnormal findings: Secondary | ICD-10-CM | POA: Diagnosis not present

## 2023-07-23 DIAGNOSIS — L649 Androgenic alopecia, unspecified: Secondary | ICD-10-CM

## 2023-07-23 LAB — LIPID PANEL
Cholesterol: 179 mg/dL (ref 0–200)
HDL: 52.8 mg/dL (ref >39.00)
LDL Cholesterol: 112 mg/dL — ABNORMAL HIGH (ref 0–99)
NonHDL: 126.21
Total CHOL/HDL Ratio: 3
Triglycerides: 71 mg/dL (ref 0.0–149.0)
VLDL: 14.2 mg/dL (ref 0.0–40.0)

## 2023-07-23 LAB — COMPREHENSIVE METABOLIC PANEL WITH GFR
ALT: 21 U/L (ref 0–53)
AST: 30 U/L (ref 0–37)
Albumin: 4.1 g/dL (ref 3.5–5.2)
Alkaline Phosphatase: 65 U/L (ref 39–117)
BUN: 11 mg/dL (ref 6–23)
CO2: 30 meq/L (ref 19–32)
Calcium: 9.2 mg/dL (ref 8.4–10.5)
Chloride: 103 meq/L (ref 96–112)
Creatinine, Ser: 0.92 mg/dL (ref 0.40–1.50)
GFR: 98.69 mL/min (ref >60.00)
Glucose, Bld: 87 mg/dL (ref 70–99)
Potassium: 4.4 meq/L (ref 3.5–5.1)
Sodium: 137 meq/L (ref 135–145)
Total Bilirubin: 0.7 mg/dL (ref 0.2–1.2)
Total Protein: 6.2 g/dL (ref 6.0–8.3)

## 2023-07-23 LAB — CBC
HCT: 42.5 % (ref 39.0–52.0)
Hemoglobin: 14.2 g/dL (ref 13.0–17.0)
MCHC: 33.3 g/dL (ref 30.0–36.0)
MCV: 93.9 fl (ref 78.0–100.0)
Platelets: 198 10*3/uL (ref 150.0–400.0)
RBC: 4.53 Mil/uL (ref 4.22–5.81)
RDW: 13.6 % (ref 11.5–15.5)
WBC: 5 10*3/uL (ref 4.0–10.5)

## 2023-07-23 MED ORDER — PHENTERMINE HCL 37.5 MG PO CAPS
37.5000 mg | ORAL_CAPSULE | ORAL | 2 refills | Status: DC
Start: 2023-07-23 — End: 2023-10-20

## 2023-07-23 NOTE — Progress Notes (Signed)
 Subjective:    Patient ID: Jorge Walters, male    DOB: [birth year], 48 y.o.   MRN: [id number]  HPI  Patient presents for yearly preventative medicine examination. He is a pleasant 48 year old male who  has a past medical history of Allergy, Chicken pox, and Obesity.  Obesity - currently maintained on Phentermine  30 mg daily. He is tolerating this medication well with no side effects. He is exercising five days a week with either running/walking, biking or weight training. His weight seems to have hit a plateau.   Wt Readings from Last 3 Encounters:  07/23/23 241 lb (109.3 kg)  04/29/23 236 lb (107 kg)  04/23/23 234 lb (106.1 kg)   Hyperlipidemia - mildly elevated LDL in the past.   Male Pattern Baldness - he uses Propecia and Minoxidil    All immunizations and health maintenance protocols were reviewed with the patient and needed orders were placed.  Appropriate screening laboratory values were ordered for the patient including screening of hyperlipidemia, renal function and hepatic function.   Medication reconciliation,  past medical history, social history, problem list and allergies were reviewed in detail with the patient  Goals were established with regard to weight loss, exercise, and  diet in compliance with medications   Review of Systems  Constitutional: Negative.   HENT: Negative.    Eyes: Negative.   Respiratory: Negative.    Cardiovascular: Negative.   Gastrointestinal: Negative.   Endocrine: Negative.   Genitourinary: Negative.   Musculoskeletal: Negative.   Skin: Negative.   Allergic/Immunologic: Negative.   Neurological: Negative.   Hematological: Negative.   Psychiatric/Behavioral: Negative.    All other systems reviewed and are negative.  Past Medical History:  Diagnosis Date   Allergy    Chicken pox    Obesity     Social History   Socioeconomic History   Marital status: Married    Spouse name: Not on file   Number of children: Not  on file   Years of education: Not on file   Highest education level: Bachelor's degree (e.g., BA, AB, BS)  Occupational History   Not on file  Tobacco Use   Smoking status: Never   Smokeless tobacco: Never  Vaping Use   Vaping status: Never Used  Substance and Sexual Activity   Alcohol use: Yes    Alcohol/week: 2.0 standard drinks of alcohol    Types: 1 Glasses of wine, 1 Cans of beer per week    Comment: 'social"   Drug use: No   Sexual activity: Not on file  Other Topics Concern   Not on file  Social History Narrative   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    Married    No children       Social Drivers of Corporate investment banker Strain: Low Risk  (07/19/2023)   Overall Financial Resource Strain (CARDIA)    Difficulty of Paying Living Expenses: Not hard at all  Food Insecurity: No Food Insecurity (07/19/2023)   Hunger Vital Sign    Worried About Running Out of Food in the Last Year: Never true    Ran Out of Food in the Last Year: Never true  Transportation Needs: No Transportation Needs (07/19/2023)   PRAPARE - Administrator, Civil Service (Medical): No    Lack of Transportation (Non-Medical): No  Physical Activity: Sufficiently Active (07/19/2023)   Exercise Vital Sign    Days of Exercise per Week: 5 days  Minutes of Exercise per Session: 90 min  Stress: No Stress Concern Present (07/19/2023)   [employer] of Occupational Health - Occupational Stress Questionnaire    Feeling of Stress : Not at all  Social Connections: Moderately Isolated (07/19/2023)   Social Connection and Isolation Panel [NHANES]    Frequency of Communication with Friends and Family: More than three times a week    Frequency of Social Gatherings with Friends and Family: Once a week    Attends Religious Services: Never    Database administrator or Organizations: No    Attends Engineer, structural: Not on file    Marital Status: Married  Careers information officer Violence: Not on file    Past Surgical History:  Procedure Laterality Date   LASER ABLATION Left 04/15/2023   Left Greater saphenous vein with greater than 20 micros by Kerry Peel    Family History  Problem Relation Age of Onset   Stroke Father    Diabetes Father    Esophageal cancer Maternal Uncle    Heart attack Maternal Grandfather    Stomach cancer Neg Hx    Rectal cancer Neg Hx    Colon cancer Neg Hx    Colon polyps Neg Hx     No Known Allergies  Current Outpatient Medications on File Prior to Visit  Medication Sig Dispense Refill   [employer] SUNSCREEN SPF 30 EX apply topically to face and body daily for 30     finasteride (PROPECIA) 1 MG tablet      GLUCOSAMINE-CHONDROITIN PO Take by mouth.     Minoxidil 5 % FOAM      Multiple Vitamin (MULTIVITAMIN) capsule Take 2 capsules by mouth daily.     Omega-3 Fatty Acids (FISH OIL) 1000 MG CAPS Take by mouth.     OVER THE COUNTER MEDICATION Syllium husk     phentermine  30 MG capsule Take 1 capsule (30 mg total) by mouth every morning. 30 capsule 2   No current facility-administered medications on file prior to visit.    BP 100/78   Pulse 68   Temp 98.2 F (36.8 C) (Oral)   Ht 5\' 9"  (1.753 m)   Wt 241 lb (109.3 kg)   SpO2 98%   BMI 35.59 kg/m       Objective:   Physical Exam Vitals and nursing note reviewed.  Constitutional:      General: He is not in acute distress.    Appearance: Normal appearance. He is obese. He is not ill-appearing.  HENT:     Head: Normocephalic and atraumatic.     Right Ear: Tympanic membrane, ear canal and external ear normal. There is no impacted cerumen.     Left Ear: Tympanic membrane, ear canal and external ear normal. There is no impacted cerumen.     Nose: Nose normal. No congestion or rhinorrhea.     Mouth/Throat:     Mouth: Mucous membranes are moist.     Pharynx: Oropharynx is clear.  Eyes:     Extraocular Movements: Extraocular movements intact.      Conjunctiva/sclera: Conjunctivae normal.     Pupils: Pupils are equal, round, and reactive to light.  Neck:     Vascular: No carotid bruit.  Cardiovascular:     Rate and Rhythm: Normal rate and regular rhythm.     Pulses: Normal pulses.     Heart sounds: No murmur heard.    No friction rub. No gallop.  Pulmonary:     Effort:  Pulmonary effort is normal.     Breath sounds: Normal breath sounds.  Abdominal:     General: Abdomen is flat. Bowel sounds are normal. There is no distension.     Palpations: Abdomen is soft. There is no mass.     Tenderness: There is no abdominal tenderness. There is no guarding or rebound.     Hernia: No hernia is present.  Musculoskeletal:        General: Normal range of motion.     Cervical back: Normal range of motion and neck supple.  Lymphadenopathy:     Cervical: No cervical adenopathy.  Skin:    General: Skin is warm and dry.     Capillary Refill: Capillary refill takes less than 2 seconds.  Neurological:     General: No focal deficit present.     Mental Status: He is alert and oriented to person, place, and time.  Psychiatric:        Mood and Affect: Mood normal.        Behavior: Behavior normal.        Thought Content: Thought content normal.        Judgment: Judgment normal.           Assessment & Plan:  1. Routine general medical examination at a health care facility (Primary) Today patient counseled on age appropriate routine health concerns for screening and prevention, each reviewed and up to date or declined. Immunizations reviewed and up to date or declined. Labs ordered and reviewed. Risk factors for depression reviewed and negative. Hearing function and visual acuity are intact. ADLs screened and addressed as needed. Functional ability and level of safety reviewed and appropriate. Education, counseling and referrals performed based on assessed risks today. Patient provided with a copy of personalized plan for preventive services. -  Continue to exercise and eat healthy  - Follow up in one year or sooner if needed   2. Obesity (BMI 35.0-39.9 without comorbidity) - Will increase his dose to 37.5  - Follow up in 3 months or sooner if needed - phentermine  37.5 MG capsule; Take 1 capsule (37.5 mg total) by mouth every morning.  Dispense: 30 capsule; Refill: 2 - Lipid panel; Future - TSH; Future - CBC; Future - Comprehensive metabolic panel with GFR; Future  3. Hyperlipidemia, unspecified hyperlipidemia type - Consider statin  - Lipid panel; Future - TSH; Future - CBC; Future - Comprehensive metabolic panel with GFR; Future  4. Prostate cancer screening  - PSA; Future  5. Male pattern baldness - Continue with Propecia and Minoxidil   Andy Moye, NP

## 2023-07-29 LAB — TSH: TSH: 1.34 u[IU]/mL (ref 0.35–5.50)

## 2023-07-29 LAB — PSA: PSA: 0.7 ng/mL (ref 0.10–4.00)

## 2023-09-02 ENCOUNTER — Ambulatory Visit: Attending: Surgery | Admitting: Surgery

## 2023-09-02 ENCOUNTER — Encounter: Payer: Self-pay | Admitting: Surgery

## 2023-09-02 VITALS — BP 109/65 | HR 88 | Temp 97.7°F | Resp 18 | Ht 69.0 in | Wt 236.0 lb

## 2023-09-02 DIAGNOSIS — I83811 Varicose veins of right lower extremities with pain: Secondary | ICD-10-CM | POA: Diagnosis not present

## 2023-09-02 HISTORY — PX: LASER ABLATION: SHX1947

## 2023-09-02 NOTE — Progress Notes (Signed)
    Laser Ablation Procedure    Date: 09/02/2023 Jorge Walters DOB:1975-02-18 Consent signed: Yes     Surgeon:  Dr. Gaile New Procedure: Laser Ablation: right Anterior Accessory saphenous vein Saphenous Vein BP 109/65 (BP Location: Left Arm, Patient Position: Sitting, Cuff Size: Large)   Pulse 88   Temp 97.7 F (36.5 C) (Temporal)   Resp 18   Ht 5' 9 (1.753 m)   Wt 236 lb (107 kg)   SpO2 98%   BMI 34.85 kg/m  Tumescent Anesthesia: 550 cc 0.9% NaCl with 50 cc Lidocaine HCL 1%  and 15 cc 8.4% NaHCO3 Local Anesthesia: 4 cc Lidocaine HCL and NaHCO3 (ratio 2:1) 7 watts continuous mode    Total energy: 827.7 joules    Total time: 118 seconds Treatment Length 17 cm Laser Fiber Ref. #  [id number]    Lot # [id number]  Stab Phlebectomy: > 20 Sites: Thigh and Calf Patient tolerated procedure well Notes:  All staff members wore facial masks and facial shields/goggles. Pt had 1 tab of 1 mg of ativan  at 07:15 am prior to surgical procedure   Description of Procedure: After marking the course of the secondary varicosities, the patient was placed on the operating table in the supine position, and the right leg was prepped and draped in sterile fashion.   Local anesthetic was administered and under ultrasound guidance the saphenous vein was accessed with a micro needle and guide wire; then the mirco puncture sheath was placed.  A guide wire was inserted saphenofemoral junction , followed by a 5 french sheath.  The position of the sheath and then the laser fiber below the junction was confirmed using the ultrasound.  Tumescent anesthesia was administered along the course of the saphenous vein using ultrasound guidance. The patient was placed in Trendelenburg position and protective laser glasses were placed on patient and staff, and the laser was fired at 7 watts continuous mode for a total of 827.7 joules.  For stab phlebectomies, local anesthetic was administered at the previously marked  varicosities, and tumescent anesthesia was administered around the vessels.  > 20 stab wounds were made using the tip of an 11 blade. And using the vein hook, the phlebectomies were performed using a hemostat to avulse the varicosities.  Adequate hemostasis was achieved.   Steri strips were applied to the stab wounds and ABD pads and thigh high compression stockings were applied.  Ace wrap bandages were applied over the phlebectomy sites and at the top of the saphenofemoral junction. Blood loss was less than 15 cc.  Discharge instructions reviewed with patient and hardcopy of discharge instructions given to patient to take home. The patient was wheeled out of the operating room having tolerated the procedure well.

## 2023-09-17 ENCOUNTER — Encounter (INDEPENDENT_AMBULATORY_CARE_PROVIDER_SITE_OTHER): Payer: Self-pay | Admitting: Otolaryngology

## 2023-09-17 ENCOUNTER — Ambulatory Visit (INDEPENDENT_AMBULATORY_CARE_PROVIDER_SITE_OTHER): Payer: BC Managed Care – PPO | Admitting: Otolaryngology

## 2023-09-17 VITALS — BP 122/83 | HR 82

## 2023-09-17 DIAGNOSIS — H9042 Sensorineural hearing loss, unilateral, left ear, with unrestricted hearing on the contralateral side: Secondary | ICD-10-CM

## 2023-09-18 NOTE — Progress Notes (Signed)
 Follow-up: Left ear hearing loss  HPI: The patient is a 48 year old male who returns today for his follow-up evaluation.  He was last seen in January 2025.  At that time, he was noted to have asymmetric left ear high-frequency sensorineural hearing loss.  His ear canals, tympanic membranes, and middle ear spaces were all normal.  His subsequent MRI scan was negative for retrocochlear lesion.  The patient returns today reporting no significant change in his hearing.  He denies any recent otitis media or otitis externa.  Currently he denies any otalgia, otorrhea, or vertigo.  Exam: General: Communicates without difficulty, well nourished, no acute distress. Head: Normocephalic, no evidence injury, no tenderness, facial buttresses intact without stepoff. Face/sinus: No tenderness to palpation and percussion. Facial movement is normal and symmetric. Eyes: PERRL, EOMI. No scleral icterus, conjunctivae clear. Neuro: CN II exam reveals vision grossly intact.  No nystagmus at any point of gaze. Ears: Auricles well formed without lesions.  Ear canals are intact without mass or lesion.  No erythema or edema is appreciated.  The TMs are intact without fluid. Nose: External evaluation reveals normal support and skin without lesions.  Dorsum is intact.  Anterior rhinoscopy reveals normal mucosa over anterior aspect of inferior turbinates and intact septum.  No purulence noted. Oral:  Oral cavity and oropharynx are intact, symmetric, without erythema or edema.  Mucosa is moist without lesions. Neck: Full range of motion without pain.  There is no significant lymphadenopathy.  No masses palpable.  Thyroid  bed within normal limits to palpation.  Parotid glands and submandibular glands equal bilaterally without mass.  Trachea is midline. Neuro:  CN 2-12 grossly intact.   Assessment: 1.  Subjectively stable asymmetric left ear high-frequency sensorineural hearing loss. 2.  His MRI scan is negative for retrocochlear  lesion. 3.  His ear canals, tympanic membranes, and middle ear spaces are normal.  Plan: 1.  The physical exam findings are reviewed with the patient. 2.  The MRI results also reviewed. 3.  The patient will return for reevaluation in 6 months with repeat hearing test.

## 2023-09-20 ENCOUNTER — Encounter: Admitting: Surgery

## 2023-09-20 ENCOUNTER — Encounter (HOSPITAL_COMMUNITY)

## 2023-09-23 ENCOUNTER — Encounter: Payer: Self-pay | Admitting: Surgery

## 2023-09-23 ENCOUNTER — Other Ambulatory Visit: Payer: Self-pay

## 2023-09-23 ENCOUNTER — Ambulatory Visit: Attending: Surgery | Admitting: Surgery

## 2023-09-23 ENCOUNTER — Ambulatory Visit (HOSPITAL_COMMUNITY)
Admission: RE | Admit: 2023-09-23 | Discharge: 2023-09-23 | Disposition: A | Discharge status: Home / Self Care | Source: Ambulatory Visit | Attending: Vascular Surgery | Admitting: Vascular Surgery

## 2023-09-23 VITALS — BP 133/84 | HR 91 | Temp 98.6°F | Resp 18 | Ht 69.0 in | Wt 240.8 lb

## 2023-09-23 DIAGNOSIS — E7800 Pure hypercholesterolemia, unspecified: Secondary | ICD-10-CM

## 2023-09-23 DIAGNOSIS — I83811 Varicose veins of right lower extremities with pain: Secondary | ICD-10-CM | POA: Insufficient documentation

## 2023-09-23 NOTE — Progress Notes (Signed)
 Patient name: Jorge Walters MRN: [id number] DOB: May 17, 1975 Sex: male  REASON FOR VISIT:     Post op  HISTORY OF PRESENT ILLNESS:   Jorge Walters is a 48 y.o. male who is status post endovenous laser ablation of the right anterior accessory vein and greater than 20 stabs on the right leg on 09/02/2023.  He had previously undergone laser ablation of the left greater saphenous vein with greater than 20 stabs on 04/15/2023.  This was for class II disease.  He has no complaints today.  He continues to wear his compression stocking.  CURRENT MEDICATIONS:    Current Outpatient Medications  Medication Sig Dispense Refill   [employer] SUNSCREEN SPF 30 EX apply topically to face and body daily for 30     finasteride (PROPECIA) 1 MG tablet      GLUCOSAMINE-CHONDROITIN PO Take by mouth.     Minoxidil 5 % FOAM      Multiple Vitamin (MULTIVITAMIN) capsule Take 2 capsules by mouth daily.     Omega-3 Fatty Acids (FISH OIL) 1000 MG CAPS Take by mouth.     OVER THE COUNTER MEDICATION Syllium husk     phentermine  37.5 MG capsule Take 1 capsule (37.5 mg total) by mouth every morning. 30 capsule 2   No current facility-administered medications for this visit.    REVIEW OF SYSTEMS:   [X]  denotes positive finding, [ ]  denotes negative finding Cardiac  Comments:  Chest pain or chest pressure:    Shortness of breath upon exertion:    Short of breath when lying flat:    Irregular heart rhythm:    Constitutional    Fever or chills:      PHYSICAL EXAM:   Vitals:   09/23/23 1001  BP: 133/84  Pulse: 91  Resp: 18  Temp: 98.6 F (37 C)  TempSrc: Temporal  SpO2: 99%  Weight: 240 lb 12.8 oz (109.2 kg)  Height: 5' 9 (1.753 m)    GENERAL: The patient is a well-nourished male, in no acute distress. The vital signs are documented above. CARDIOVASCULAR: There is a regular rate and rhythm. PULMONARY: Non-labored respirations Incisions are all healing  appropriately  STUDIES:   Venous reflux study Right:  - No evidence of deep vein thrombosis seen in the right lower extremity,  from the common femoral through the popliteal veins.  - Successful ablation of the anterior accessory saphenous vein.    MEDICAL ISSUES:   Status post bilateral laser ablation and greater than 20 stabs.  Patient tolerated both procedures well and is recovering as expected.  There is no evidence of DVT on his ultrasound today and there was successful ablation of the anterior saphenous vein.  He will continue to wear his stockings as needed.  He will follow-up for his venous disease on an as-needed basis.  He may consider sclerotherapy at a later date but I told him to wait several months to see how much of this goes away on its own  He was told by the ultrasound tech at his work that he had calcified tibial vessels.  On exam he has palpable pedal pulses.  I did look at his cholesterol and his LDL was 112.  I have told him to speak with his primary care physician about a statin.  We also talked about a coronary calcium study.  I will try to get him added on today.  I told him that he would need to go over these results with his  primary care physician.  Malvina Serene CLORE, MD, FACS [facility] [phone] Pager [phone]

## 2023-09-24 ENCOUNTER — Encounter: Admitting: Surgery

## 2023-09-24 ENCOUNTER — Encounter (HOSPITAL_COMMUNITY)

## 2023-09-30 DIAGNOSIS — H0100B Unspecified blepharitis left eye, upper and lower eyelids: Secondary | ICD-10-CM | POA: Diagnosis not present

## 2023-09-30 DIAGNOSIS — H0100A Unspecified blepharitis right eye, upper and lower eyelids: Secondary | ICD-10-CM | POA: Diagnosis not present

## 2023-09-30 DIAGNOSIS — B880 Other acariasis: Secondary | ICD-10-CM | POA: Diagnosis not present

## 2023-09-30 DIAGNOSIS — H04123 Dry eye syndrome of bilateral lacrimal glands: Secondary | ICD-10-CM | POA: Diagnosis not present

## 2023-10-05 ENCOUNTER — Inpatient Hospital Stay (HOSPITAL_COMMUNITY)
Admission: RE | Admit: 2023-10-05 | Discharge: 2023-10-05 | Discharge status: Home / Self Care | Payer: Self-pay | Source: Ambulatory Visit | Attending: Surgery | Admitting: Surgery

## 2023-10-05 DIAGNOSIS — Z136 Encounter for screening for cardiovascular disorders: Secondary | ICD-10-CM | POA: Insufficient documentation

## 2023-10-05 DIAGNOSIS — E7800 Pure hypercholesterolemia, unspecified: Secondary | ICD-10-CM | POA: Insufficient documentation

## 2023-10-20 ENCOUNTER — Other Ambulatory Visit: Payer: Self-pay | Admitting: Adult Health

## 2023-10-20 DIAGNOSIS — E669 Obesity, unspecified: Secondary | ICD-10-CM

## 2023-11-30 DIAGNOSIS — H0100A Unspecified blepharitis right eye, upper and lower eyelids: Secondary | ICD-10-CM | POA: Diagnosis not present

## 2023-11-30 DIAGNOSIS — H0100B Unspecified blepharitis left eye, upper and lower eyelids: Secondary | ICD-10-CM | POA: Diagnosis not present

## 2023-11-30 DIAGNOSIS — B8809 Other acariasis: Secondary | ICD-10-CM | POA: Diagnosis not present

## 2024-01-07 ENCOUNTER — Telehealth: Admitting: Adult Health

## 2024-01-07 VITALS — Ht 69.0 in | Wt 240.0 lb

## 2024-01-07 DIAGNOSIS — E669 Obesity, unspecified: Secondary | ICD-10-CM | POA: Diagnosis not present

## 2024-01-07 DIAGNOSIS — L649 Androgenic alopecia, unspecified: Secondary | ICD-10-CM | POA: Diagnosis not present

## 2024-01-07 MED ORDER — FINASTERIDE 1 MG PO TABS: 1.0000 mg | ORAL_TABLET | Freq: Every day | ORAL | 1 refills | Status: AC

## 2024-01-07 MED ORDER — PHENTERMINE HCL 37.5 MG PO CAPS: 37.5000 mg | ORAL_CAPSULE | Freq: Every morning | ORAL | 2 refills | Status: AC

## 2024-01-07 NOTE — Progress Notes (Signed)
 Virtual Visit via Video Note  I connected with Selinda Piety on 01/07/24 at  9:00 AM EST by a video enabled telemedicine application and verified that I am speaking with the correct person using two identifiers.  Location patient: home Location provider:work or home office Persons participating in the virtual visit: patient, provider  I discussed the limitations of evaluation and management by telemedicine and the availability of in person appointments. The patient expressed understanding and agreed to proceed.   HPI:   Obesity - currently maintained on Phentermine  37.5 mg daily. He is tolerating this medication well with no side effects. He is exercising five days a week with either running/walking, biking or weight training. His weight seems to have hit a plateau but he is weight lifting more   Wt Readings from Last 3 Encounters:  01/07/24 240 lb (108.9 kg)  09/23/23 240 lb 12.8 oz (109.2 kg)  09/02/23 236 lb (107 kg)   He also has been getting Propecia  for male pattern baldness through an online pharmacy for over a year and is wondering if I can take this over for him. He has had his PSA checked about 5 months ago with a reading of 0.70  ROS: See pertinent positives and negatives per HPI.  Past Medical History:  Diagnosis Date   Allergy    Chicken pox    Obesity     Past Surgical History:  Procedure Laterality Date   LASER ABLATION Left 04/15/2023   Left Greater saphenous vein with greater than 20 micros by Gaile Serene COME   LASER ABLATION Right 09/02/2023   Laser ablation of the right anterior accessory saphenous vein with >20 Stab phlebectomies    Family History  Problem Relation Age of Onset   Stroke Father    Diabetes Father    Esophageal cancer Maternal Uncle    Heart attack Maternal Grandfather    Stomach cancer Neg Hx    Rectal cancer Neg Hx    Colon cancer Neg Hx    Colon polyps Neg Hx        Current Outpatient Medications:    CVS SUNSCREEN SPF 30  EX, apply topically to face and body daily for 30, Disp: , Rfl:    GLUCOSAMINE-CHONDROITIN PO, Take by mouth., Disp: , Rfl:    Minoxidil 5 % FOAM, , Disp: , Rfl:    Multiple Vitamin (MULTIVITAMIN) capsule, Take 2 capsules by mouth daily., Disp: , Rfl:    Omega-3 Fatty Acids (FISH OIL) 1000 MG CAPS, Take by mouth., Disp: , Rfl:    OVER THE COUNTER MEDICATION, Syllium husk, Disp: , Rfl:    phentermine  37.5 MG capsule, TAKE 1 CAPSULE BY MOUTH EVERY MORNING, Disp: 30 capsule, Rfl: 2   finasteride  (PROPECIA ) 1 MG tablet, , Disp: , Rfl:   EXAM:  VITALS per patient if applicable:  GENERAL: alert, oriented, appears well and in no acute distress  HEENT: atraumatic, conjunttiva clear, no obvious abnormalities on inspection of external nose and ears  NECK: normal movements of the head and neck  LUNGS: on inspection no signs of respiratory distress, breathing rate appears normal, no obvious gross SOB, gasping or wheezing  CV: no obvious cyanosis  MS: moves all visible extremities without noticeable abnormality  PSYCH/NEURO: pleasant and cooperative, no obvious depression or anxiety, speech and thought processing grossly intact  ASSESSMENT AND PLAN:  Discussed the following assessment and plan:  1. Obesity (BMI 35.0-39.9 without comorbidity) - Continue to exercise and eat healthy. Will keep him on Phentermine   37.5 mg daily x 3 months - Follow up in 3 months virtually.  - phentermine  37.5 MG capsule; Take 1 capsule (37.5 mg total) by mouth every morning.  Dispense: 30 capsule; Refill: 2  2. Male pattern baldness (Primary)  - finasteride  (PROPECIA ) 1 MG tablet; Take 1 tablet (1 mg total) by mouth daily.  Dispense: 90 tablet; Refill: 1      I discussed the assessment and treatment plan with the patient. The patient was provided an opportunity to ask questions and all were answered. The patient agreed with the plan and demonstrated an understanding of the instructions.   The patient was  advised to call back or seek an in-person evaluation if the symptoms worsen or if the condition fails to improve as anticipated.   Nakaya Mishkin, NP

## 2024-03-06 ENCOUNTER — Encounter: Payer: Self-pay | Admitting: Family Medicine

## 2024-03-06 ENCOUNTER — Ambulatory Visit: Admitting: Family Medicine

## 2024-03-06 ENCOUNTER — Ambulatory Visit: Payer: Self-pay

## 2024-03-06 ENCOUNTER — Encounter: Payer: Self-pay | Admitting: Adult Health

## 2024-03-06 VITALS — BP 128/88 | HR 83 | Temp 97.7°F | Resp 16 | Ht 69.0 in | Wt 243.4 lb

## 2024-03-06 DIAGNOSIS — R58 Hemorrhage, not elsewhere classified: Secondary | ICD-10-CM

## 2024-03-06 DIAGNOSIS — M25579 Pain in unspecified ankle and joints of unspecified foot: Secondary | ICD-10-CM

## 2024-03-06 NOTE — Progress Notes (Unsigned)
 "  ACUTE VISIT Chief Complaint  Patient presents with   Toe Pain    Included with foot pain x 1.5 month (both feet) - swelling x 1 week    Discussed the use of AI scribe software for clinical note transcription with the patient, who gave verbal consent to proceed.  History of Present Illness Jorge Walters is a 49 year old male with PMHx of chronic venous insufficiency, who presents with swelling and pain in his toes as described above.  He has been experiencing swelling in his toes for about a week, with the third toe on the left foot and the fourth toe on the right foot being most affected. The swelling was more pronounced a few minutes prior to the visit but has slightly decreased since. Additional swelling is noted in other toes.  The swelling is accompanied by sharp, pain more severe since yesterday, rated as 7-8 on the pain scale, almost bringing him to tears. He has been taking ibuprofen for pain management, with doses at 1 PM and 6 PM yesterday, and continues to use it for relief.  He has a history of chronic venous insufficiency and has undergone saphenous vein removal.  States that his feet get cold, for a while and especially in winter, which has worsened since starting on the highest dose of phentermine .   Toes pain worsens when he is inactive. No hx of trauma. No PMHx of gout. No hx of trauma, new shoes,or unusual activity. Fhx negative for lupus or other rheumatologic condition.  He practices yoga regularly without previous issues. He reports a history of athlete's foot, which caused a burning sensation, treated with Tinactin and Lotrimin.   No chest pain, difficulty breathing, palpitations, or changes in the color/edema of his fingertips. He also denies any numbness, tingling, or burning sensation in his feet, except for the previous athlete's foot episode. Negative for melena,blood in stool,or gross hematuria.  He has been wearing compression sleeves for his  chronic venous insufficiency but had stopped for a while. He resumed wearing them recently, which seemed to help with the issue before the current swelling and bruising began.  Review of Systems  Constitutional:  Negative for activity change, appetite change, chills and fever.  HENT:  Negative for mouth sores and sore throat.   Respiratory:  Negative for cough and shortness of breath.   Gastrointestinal:  Negative for abdominal pain, nausea and vomiting.  Endocrine: Negative for cold intolerance and heat intolerance.  Genitourinary:  Negative for decreased urine volume and dysuria.  Skin:  Negative for rash.  Neurological:  Negative for syncope, weakness and headaches.  See other pertinent positives and negatives in HPI.  Medications Ordered Prior to Encounter[1]  Past Medical History:  Diagnosis Date   Allergy    Chicken pox    Obesity    Allergies[2]  Social History   Socioeconomic History   Marital status: Married    Spouse name: Not on file   Number of children: Not on file   Years of education: Not on file   Highest education level: Bachelor's degree (e.g., BA, AB, BS)  Occupational History   Not on file  Tobacco Use   Smoking status: Never   Smokeless tobacco: Never  Vaping Use   Vaping status: Never Used  Substance and Sexual Activity   Alcohol use: Yes    Alcohol/week: 2.0 standard drinks of alcohol    Types: 1 Glasses of wine, 1 Cans of beer per week  Comment: 'social   Drug use: No   Sexual activity: Not on file  Other Topics Concern   Not on file  Social History Narrative   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    Married    No children       Social Drivers of Health   Tobacco Use: Low Risk (03/06/2024)   Patient History    Smoking Tobacco Use: Never    Smokeless Tobacco Use: Never    Passive Exposure: Not on file  Financial Resource Strain: Low Risk (01/07/2024)   Overall Financial Resource Strain (CARDIA)     Difficulty of Paying Living Expenses: Not hard at all  Food Insecurity: No Food Insecurity (01/07/2024)   Epic    Worried About Programme Researcher, Broadcasting/film/video in the Last Year: Never true    Ran Out of Food in the Last Year: Never true  Transportation Needs: No Transportation Needs (01/07/2024)   Epic    Lack of Transportation (Medical): No    Lack of Transportation (Non-Medical): No  Physical Activity: Sufficiently Active (01/07/2024)   Exercise Vital Sign    Days of Exercise per Week: 5 days    Minutes of Exercise per Session: 90 min  Stress: No Stress Concern Present (01/07/2024)   [employer] of Occupational Health - Occupational Stress Questionnaire    Feeling of Stress: Not at all  Social Connections: Moderately Isolated (01/07/2024)   Social Connection and Isolation Panel    Frequency of Communication with Friends and Family: More than three times a week    Frequency of Social Gatherings with Friends and Family: Once a week    Attends Religious Services: Never    Database Administrator or Organizations: No    Attends Engineer, Structural: Not on file    Marital Status: Married  Depression (PHQ2-9): Low Risk (07/23/2023)   Depression (PHQ2-9)    PHQ-2 Score: 0  Alcohol Screen: Low Risk (01/07/2024)   Alcohol Screen    Last Alcohol Screening Score (AUDIT): 2  Housing: Unknown (01/07/2024)   Epic    Unable to Pay for Housing in the Last Year: No    Number of Times Moved in the Last Year: Not on file    Homeless in the Last Year: No  Utilities: Not on file  Health Literacy: Not on file   Vitals:   03/06/24 1506  BP: 128/88  Pulse: 83  Resp: 16  Temp: 97.7 F (36.5 C)  SpO2: 97%   Body mass index is 35.94 kg/m.  Physical Exam Vitals and nursing note reviewed.  Constitutional:      General: He is not in acute distress.    Appearance: He is well-developed.  HENT:     Head: Normocephalic and atraumatic.  Eyes:     Conjunctiva/sclera: Conjunctivae normal.   Cardiovascular:     Rate and Rhythm: Normal rate and regular rhythm.     Heart sounds: No murmur heard. Pulmonary:     Effort: Pulmonary effort is normal. No respiratory distress.     Breath sounds: Normal breath sounds.  Musculoskeletal:     Right foot: Normal capillary refill. No swelling. Normal pulse.     Left foot: Normal capillary refill. No swelling. Normal pulse.  Skin:    General: Skin is warm.     Findings: Ecchymosis present. No erythema or rash.     Comments: Some periungual erythema right 2nd and 3rd toe and 3rd left, no local heat,tenderness,or induration.  Some toes with ecchymosis/cyanosis. No ulcers. No significant tenderness. See pictures.  Neurological:     General: No focal deficit present.     Mental Status: He is oriented to person, place, and time.     Gait: Gait normal.  Psychiatric:        Mood and Affect: Mood and affect normal.        ASSESSMENT AND PLAN:  Mr. Jorge Walters was seen today for toe pain.  Diagnoses and all orders for this visit: Orders Placed This Encounter  Procedures   C-reactive protein   Sedimentation rate   ANA   CBC   Basic metabolic panel with GFR  ***  Arthralgia of toe, unspecified laterality With associated edema. It seems like symptoms have been going on for some time, no hx of trauma, so I do not think imaging here in the office is needed. We discussed possible etiologies, OA,gout,and other rheumatologic disorders.  Symptoms have improved with Ibuprofen, so recommend continuing 400 mg tid with food for 7-10 days. Elevation and movement, the latter one sees to help. F/U with PCP in 10 days.  -     C-reactive protein; Future -     ANA; Future -     CBC; Future -     Basic metabolic panel with GFR; Future -     Sedimentation rate  Ecchymosis Noted today, affecting some distal phalanges of some toes.  Microvascular disorders as well as rheumatologic conditions to be considered. No signs of abnormal bleeding  reported. Monitor for new symptoms. Clearly instructed about warning signs.  -     C-reactive protein; Future -     ANA; Future -     CBC; Future -     Basic metabolic panel with GFR; Future -     Sedimentation rate  Return in about 10 days (around 03/16/2024) for toes edema and pain with PCP.  Dawnmarie Breon G. Chaniyah Jahr, MD  [facility]. [place] office.     [1]  Current Outpatient Medications on File Prior to Visit  Medication Sig Dispense Refill   [employer] SUNSCREEN SPF 30 EX apply topically to face and body daily for 30     finasteride  (PROPECIA ) 1 MG tablet Take 1 tablet (1 mg total) by mouth daily. 90 tablet 1   GLUCOSAMINE-CHONDROITIN PO Take by mouth.     Minoxidil 5 % FOAM      Multiple Vitamin (MULTIVITAMIN) capsule Take 2 capsules by mouth daily.     Omega-3 Fatty Acids (FISH OIL) 1000 MG CAPS Take by mouth.     OVER THE COUNTER MEDICATION Syllium husk     phentermine  37.5 MG capsule Take 1 capsule (37.5 mg total) by mouth every morning. 30 capsule 2   No current facility-administered medications on file prior to visit.  [2] No Known Allergies  "

## 2024-03-06 NOTE — Telephone Encounter (Signed)
 FYI Only or Action Required?: FYI only for provider: appointment scheduled on 03/06/24.  Patient was last seen in primary care on 01/07/2024 by Merna Huxley, NP.  Called Nurse Triage reporting Foot Pain.  Symptoms began today.  Interventions attempted: Other: compression sleeves. Elevation .  Symptoms are: stable.  Triage Disposition: See Physician Within 24 Hours  Patient/caregiver understands and will follow disposition?:      Reason for Disposition  [1] Swollen foot AND [2] no fever  (Exceptions: Localized bump from bunions, calluses, insect bite, sting.)  Answer Assessment - Initial Assessment Questions Symptoms going on a little while on phentermine  had recent increased. Feet tend to get cold keep them covered. Runs and will change socks and forgets to shower thought was athletes  foot . Had that treated but still hurting both feet. Had procedures with vascular for veins over the summer. Put compression sleeves back on. Toes swollen had a friend who is rheumatologist to look at it had an US  on feet tomorrow but now has discoloration edges of toes distal to toenail, two  noticing more than others  distal position of toes sensitive to touch both sides cold to touch on feet .  Not unusual  to be cold given baseline but the color tinge color is new . Took Ibuprofen. After 4 hours had  pain almost brought to tears 600 mg ibuprofen, elevated feet seems to helped 4 hours later flarred again more ibuprofen. Hasn't had that pain this morning. Friend who is rheumatologist said to call PCP office for appointment. Patient requesting to be seen today any provider booked same day at 3PM     2. LOCATION: Where is the pain located?      Toes on both feet  3. PAIN: How bad is the pain?    (Scale 1-10; or mild, moderate, severe)     Yes pain comes and goes  5. CAUSE: What do you think is causing the foot pain?     Unknown  6. OTHER SYMPTOMS: Do you have any other symptoms? (e.g., leg  pain, rash, fever, numbness)      Patient denies the following chest pains, shortness of breath of breath, fever, chills  Protocols used: Foot Pain-A-AH   CRM # [id number] Owner: Vernona Odor Status: Unresolved Open  Priority: Routine Created on: 03/06/2024 11:53 AM By: Vernona Odor   Primary Information  Source  Jorge Walters (Patient)   Subject  Jorge Walters (Patient)   Topic  Clinical - Red Word Triage    Communication  Patient has pain in toes in both feet discoloration and swelling

## 2024-03-06 NOTE — Patient Instructions (Signed)
 A few things to remember from today's visit:  Edema of all toes - Plan: C-reactive protein, Sedimentation rate, ANA, CBC, Basic metabolic panel with GFR  Ecchymosis - Plan: C-reactive protein, Sedimentation rate, ANA, CBC, Basic metabolic panel with GFR  Continue appropriate foot care. Monitor for new symptoms, including sug=dden worsening pain and extension of bruising.  If you need refills for medications you take chronically, please call your pharmacy. Do not use My Chart to request refills or for acute issues that need immediate attention. If you send a my chart message, it may take a few days to be addressed, specially if I am not in the office.  Please be sure medication list is accurate. If a new problem present, please set up appointment sooner than planned today.

## 2024-03-07 ENCOUNTER — Ambulatory Visit: Payer: Self-pay | Admitting: Family Medicine

## 2024-03-07 LAB — BASIC METABOLIC PANEL WITH GFR
BUN: 18 mg/dL (ref 6–23)
CO2: 31 meq/L (ref 19–32)
Calcium: 9.4 mg/dL (ref 8.4–10.5)
Chloride: 103 meq/L (ref 96–112)
Creatinine, Ser: 0.93 mg/dL (ref 0.40–1.50)
GFR: 96.99 mL/min
Glucose, Bld: 70 mg/dL (ref 70–99)
Potassium: 4.5 meq/L (ref 3.5–5.1)
Sodium: 139 meq/L (ref 135–145)

## 2024-03-07 LAB — CBC
HCT: 43.2 % (ref 39.0–52.0)
Hemoglobin: 14.7 g/dL (ref 13.0–17.0)
MCHC: 34 g/dL (ref 30.0–36.0)
MCV: 95.2 fl (ref 78.0–100.0)
Platelets: 202 K/uL (ref 150.0–400.0)
RBC: 4.54 Mil/uL (ref 4.22–5.81)
RDW: 13.5 % (ref 11.5–15.5)
WBC: 5.8 K/uL (ref 4.0–10.5)

## 2024-03-07 LAB — SEDIMENTATION RATE: Sed Rate: 8 mm/h (ref 0–15)

## 2024-03-07 LAB — C-REACTIVE PROTEIN: CRP: <0.5 mg/dL — ABNORMAL LOW (ref 1.0–20.0)

## 2024-03-07 LAB — ANA: Anti Nuclear Antibody (ANA): NEGATIVE

## 2024-03-07 NOTE — Telephone Encounter (Signed)
 Please advise.

## 2024-03-09 ENCOUNTER — Telehealth (INDEPENDENT_AMBULATORY_CARE_PROVIDER_SITE_OTHER): Payer: Self-pay | Admitting: Otolaryngology

## 2024-03-09 NOTE — Telephone Encounter (Signed)
 Called patient and left a voicemail message requesting a call back to schedule 6 month follow up with Dr. Karis after his hearing evaluation appointment on 03/20/2024.  Also sent a MyChart message.

## 2024-03-16 ENCOUNTER — Ambulatory Visit (INDEPENDENT_AMBULATORY_CARE_PROVIDER_SITE_OTHER): Admitting: Adult Health

## 2024-03-16 VITALS — BP 110/70 | Temp 98.2°F | Ht 69.0 in | Wt 241.0 lb

## 2024-03-16 DIAGNOSIS — M79672 Pain in left foot: Secondary | ICD-10-CM

## 2024-03-16 DIAGNOSIS — M79671 Pain in right foot: Secondary | ICD-10-CM

## 2024-03-16 DIAGNOSIS — R58 Hemorrhage, not elsewhere classified: Secondary | ICD-10-CM

## 2024-03-16 NOTE — Progress Notes (Signed)
 "  Subjective:    Patient ID: Jorge Walters, male    DOB: [birth year], 49 y.o.   MRN: [id number]  HPI  Discussed the use of AI scribe software for clinical note transcription with the patient, who gave verbal consent to proceed.  History of Present Illness   Jorge Walters is a 49 year old male who presents for follow up  For the past 2-3 weeks he has had  swelling and sharp pain. The third toe of the left foot and fourth toe of the right foot are most affected rated 7 to 8 out of 10. Motrin/ibuprofen provides relief. He denies trauma. His father had gout, but he has never been diagnosed.  He noticed associated cold feet while taking higher dose phentermine . When he reduced phentermine  to every other day his symptoms improved significantly. He feels his feet have been colder this winter, which he relates to phentermine .  Recent work-up with sed rate, C-reactive protein, ANA, CBC, and BMP was normal about 10 days ago when he was seen by another provider in the office.    He has a history of chronic venous insufficiency and has undergone saphenous vein removal       Review of Systems See HPI   Past Medical History:  Diagnosis Date   Allergy    Chicken pox    Obesity     Social History   Socioeconomic History   Marital status: Married    Spouse name: Not on file   Number of children: Not on file   Years of education: Not on file   Highest education level: Bachelor's degree (e.g., BA, AB, BS)  Occupational History   Not on file  Tobacco Use   Smoking status: Never   Smokeless tobacco: Never  Vaping Use   Vaping status: Never Used  Substance and Sexual Activity   Alcohol use: Yes    Alcohol/week: 2.0 standard drinks of alcohol    Types: 1 Glasses of wine, 1 Cans of beer per week    Comment: 'social   Drug use: No   Sexual activity: Not on file  Other Topics Concern   Not on file  Social History Narrative   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    Married    No children       Social Drivers of Health   Tobacco Use: Low Risk (03/06/2024)   Patient History    Smoking Tobacco Use: Never    Smokeless Tobacco Use: Never    Passive Exposure: Not on file  Financial Resource Strain: Low Risk (01/07/2024)   Overall Financial Resource Strain (CARDIA)    Difficulty of Paying Living Expenses: Not hard at all  Food Insecurity: No Food Insecurity (01/07/2024)   Epic    Worried About Programme Researcher, Broadcasting/film/video in the Last Year: Never true    Ran Out of Food in the Last Year: Never true  Transportation Needs: No Transportation Needs (01/07/2024)   Epic    Lack of Transportation (Medical): No    Lack of Transportation (Non-Medical): No  Physical Activity: Sufficiently Active (01/07/2024)   Exercise Vital Sign    Days of Exercise per Week: 5 days    Minutes of Exercise per Session: 90 min  Stress: No Stress Concern Present (01/07/2024)   [employer] of Occupational Health - Occupational Stress Questionnaire    Feeling of Stress: Not at all  Social Connections: Moderately Isolated (01/07/2024)   Social Connection and  Isolation Panel    Frequency of Communication with Friends and Family: More than three times a week    Frequency of Social Gatherings with Friends and Family: Once a week    Attends Religious Services: Never    Database Administrator or Organizations: No    Attends Engineer, Structural: Not on file    Marital Status: Married  Catering Manager Violence: Not on file  Depression (PHQ2-9): Low Risk (07/23/2023)   Depression (PHQ2-9)    PHQ-2 Score: 0  Alcohol Screen: Low Risk (01/07/2024)   Alcohol Screen    Last Alcohol Screening Score (AUDIT): 2  Housing: Unknown (01/07/2024)   Epic    Unable to Pay for Housing in the Last Year: No    Number of Times Moved in the Last Year: Not on file    Homeless in the Last Year: No  Utilities: Not on file  Health Literacy: Not on file    Past Surgical  History:  Procedure Laterality Date   LASER ABLATION Left 04/15/2023   Left Greater saphenous vein with greater than 20 micros by Gaile Serene COME   LASER ABLATION Right 09/02/2023   Laser ablation of the right anterior accessory saphenous vein with >20 Stab phlebectomies    Family History  Problem Relation Age of Onset   Stroke Father    Diabetes Father    Esophageal cancer Maternal Uncle    Heart attack Maternal Grandfather    Stomach cancer Neg Hx    Rectal cancer Neg Hx    Colon cancer Neg Hx    Colon polyps Neg Hx     Allergies[1]  Medications Ordered Prior to Encounter[2]  BP 110/70   Temp 98.2 F (36.8 C) (Oral)   Ht 5' 9 (1.753 m)   Wt 241 lb (109.3 kg)   BMI 35.59 kg/m       Objective:   Physical Exam Vitals and nursing note reviewed.  Constitutional:      Appearance: Normal appearance.  Cardiovascular:     Pulses:          Popliteal pulses are 2+ on the right side and 2+ on the left side.       Dorsalis pedis pulses are 2+ on the right side and 2+ on the left side.       Posterior tibial pulses are 2+ on the right side and 2+ on the left side.  Musculoskeletal:     Right lower leg: No edema.     Left lower leg: No edema.  Feet:     Right foot:     Skin integrity: Dry skin present.     Left foot:     Skin integrity: Dry skin present.     Comments: Coolness and slight ecchymosis noted to distal tips of most toes bilaterally.  Neurological:     General: No focal deficit present.     Mental Status: He is alert and oriented to person, place, and time.  Psychiatric:        Mood and Affect: Mood normal.        Behavior: Behavior normal.        Thought Content: Thought content normal.        Judgment: Judgment normal.         Assessment & Plan:  Assessment and Plan    Bilateral toe pain and swelling likely secondary to phentermine -induced vasoconstriction Pain and swelling likely due to phentermine -induced vasoconstriction. Labs normal.  Symptoms improved with reduced  phentermine  use. - Discontinued phentermine  to assess symptom resolution. - Ordered x-ray of bilateral feet to evaluate for inflammatory or arthritic changes. - Consider referral to rheumatology if symptoms persist or worsen but right now this does not seem like a rheumatological issue.   Ecchymosis of toes Ecchymosis improved after reducing phentermine . No clear trauma history. Dry skin noted. - Continue to moisturize dry skin with petroleum-based products like Eucerin. - Monitor for resolution of ecchymosis and dry skin. - Consider podiatry referral if ecchymosis or dry skin persists.       Thad Osoria, NP  I personally spent a total of 40 minutes in the care of the patient today including preparing to see the patient, getting/reviewing separately obtained history, performing a medically appropriate exam/evaluation, counseling and educating, placing orders, and documenting clinical information in the EHR.     [1] No Known Allergies [2]  Current Outpatient Medications on File Prior to Visit  Medication Sig Dispense Refill   [employer] SUNSCREEN SPF 30 EX apply topically to face and body daily for 30     finasteride  (PROPECIA ) 1 MG tablet Take 1 tablet (1 mg total) by mouth daily. 90 tablet 1   GLUCOSAMINE-CHONDROITIN PO Take by mouth.     Minoxidil 5 % FOAM      Multiple Vitamin (MULTIVITAMIN) capsule Take 2 capsules by mouth daily.     Omega-3 Fatty Acids (FISH OIL) 1000 MG CAPS Take by mouth.     OVER THE COUNTER MEDICATION Syllium husk     phentermine  37.5 MG capsule Take 1 capsule (37.5 mg total) by mouth every morning. (Patient not taking: Reported on 03/16/2024) 30 capsule 2   No current facility-administered medications on file prior to visit.   "

## 2024-03-20 ENCOUNTER — Ambulatory Visit (INDEPENDENT_AMBULATORY_CARE_PROVIDER_SITE_OTHER): Admitting: Audiology

## 2024-03-27 ENCOUNTER — Other Ambulatory Visit

## 2024-04-24 ENCOUNTER — Ambulatory Visit (INDEPENDENT_AMBULATORY_CARE_PROVIDER_SITE_OTHER): Admitting: Otolaryngology

## 2024-04-24 ENCOUNTER — Ambulatory Visit (INDEPENDENT_AMBULATORY_CARE_PROVIDER_SITE_OTHER): Admitting: Audiology
